# Patient Record
Sex: Female | Born: 1940 | Race: White | Hispanic: No | Marital: Single | Smoking: Former smoker
Health system: Southern US, Community
[De-identification: ages and names within clinical notes are randomized; demographics above are authoritative.]

## PROBLEM LIST (undated history)

## (undated) DIAGNOSIS — R7303 Prediabetes: Secondary | ICD-10-CM

## (undated) DIAGNOSIS — M1990 Unspecified osteoarthritis, unspecified site: Secondary | ICD-10-CM

## (undated) DIAGNOSIS — J189 Pneumonia, unspecified organism: Secondary | ICD-10-CM

## (undated) DIAGNOSIS — Z8719 Personal history of other diseases of the digestive system: Secondary | ICD-10-CM

## (undated) DIAGNOSIS — D649 Anemia, unspecified: Secondary | ICD-10-CM

## (undated) DIAGNOSIS — C801 Malignant (primary) neoplasm, unspecified: Secondary | ICD-10-CM

## (undated) DIAGNOSIS — F419 Anxiety disorder, unspecified: Secondary | ICD-10-CM

## (undated) HISTORY — PX: TRIGGER FINGER RELEASE: SHX641

## (undated) HISTORY — PX: ABDOMINAL HYSTERECTOMY: SHX81

## (undated) HISTORY — PX: TONSILLECTOMY: SUR1361

---

## 2006-12-09 HISTORY — PX: LAPAROSCOPIC GASTRIC SLEEVE RESECTION: SHX5895

## 2006-12-09 HISTORY — PX: NEPHRECTOMY: SHX65

## 2007-12-10 DIAGNOSIS — Z8719 Personal history of other diseases of the digestive system: Secondary | ICD-10-CM

## 2007-12-10 HISTORY — DX: Personal history of other diseases of the digestive system: Z87.19

## 2015-12-10 HISTORY — PX: COLONOSCOPY W/ POLYPECTOMY: SHX1380

## 2016-01-10 DIAGNOSIS — Z905 Acquired absence of kidney: Secondary | ICD-10-CM | POA: Diagnosis not present

## 2016-01-10 DIAGNOSIS — Z85528 Personal history of other malignant neoplasm of kidney: Secondary | ICD-10-CM | POA: Diagnosis not present

## 2016-01-15 DIAGNOSIS — N3946 Mixed incontinence: Secondary | ICD-10-CM | POA: Diagnosis not present

## 2016-01-15 DIAGNOSIS — Z905 Acquired absence of kidney: Secondary | ICD-10-CM | POA: Diagnosis not present

## 2016-01-15 DIAGNOSIS — Z87440 Personal history of urinary (tract) infections: Secondary | ICD-10-CM | POA: Diagnosis not present

## 2016-01-15 DIAGNOSIS — N952 Postmenopausal atrophic vaginitis: Secondary | ICD-10-CM | POA: Diagnosis not present

## 2016-02-13 DIAGNOSIS — M170 Bilateral primary osteoarthritis of knee: Secondary | ICD-10-CM | POA: Diagnosis not present

## 2016-02-13 DIAGNOSIS — M7061 Trochanteric bursitis, right hip: Secondary | ICD-10-CM | POA: Diagnosis not present

## 2016-02-16 DIAGNOSIS — M5416 Radiculopathy, lumbar region: Secondary | ICD-10-CM | POA: Diagnosis not present

## 2016-02-16 DIAGNOSIS — M7061 Trochanteric bursitis, right hip: Secondary | ICD-10-CM | POA: Diagnosis not present

## 2016-02-19 DIAGNOSIS — M5416 Radiculopathy, lumbar region: Secondary | ICD-10-CM | POA: Diagnosis not present

## 2016-02-19 DIAGNOSIS — M5126 Other intervertebral disc displacement, lumbar region: Secondary | ICD-10-CM | POA: Diagnosis not present

## 2016-02-22 DIAGNOSIS — M549 Dorsalgia, unspecified: Secondary | ICD-10-CM | POA: Diagnosis not present

## 2016-02-22 DIAGNOSIS — E1165 Type 2 diabetes mellitus with hyperglycemia: Secondary | ICD-10-CM | POA: Diagnosis not present

## 2016-02-22 DIAGNOSIS — Z6839 Body mass index (BMI) 39.0-39.9, adult: Secondary | ICD-10-CM | POA: Diagnosis not present

## 2016-02-22 DIAGNOSIS — M79604 Pain in right leg: Secondary | ICD-10-CM | POA: Diagnosis not present

## 2016-02-22 DIAGNOSIS — Z87891 Personal history of nicotine dependence: Secondary | ICD-10-CM | POA: Diagnosis not present

## 2016-02-26 ENCOUNTER — Other Ambulatory Visit: Payer: Self-pay | Admitting: Orthopedic Surgery

## 2016-02-26 DIAGNOSIS — M545 Low back pain: Principal | ICD-10-CM

## 2016-02-26 DIAGNOSIS — G8929 Other chronic pain: Secondary | ICD-10-CM

## 2016-02-28 ENCOUNTER — Ambulatory Visit
Admission: RE | Admit: 2016-02-28 | Discharge: 2016-02-28 | Disposition: A | Discharge status: Home / Self Care | Payer: Medicare Other | Source: Ambulatory Visit | Attending: Orthopedic Surgery | Admitting: Orthopedic Surgery

## 2016-02-28 DIAGNOSIS — G8929 Other chronic pain: Secondary | ICD-10-CM

## 2016-02-28 DIAGNOSIS — M545 Low back pain: Principal | ICD-10-CM

## 2016-02-28 DIAGNOSIS — M47816 Spondylosis without myelopathy or radiculopathy, lumbar region: Secondary | ICD-10-CM | POA: Diagnosis not present

## 2016-02-28 MED ORDER — IOHEXOL 180 MG/ML  SOLN
1.0000 mL | Freq: Once | INTRAMUSCULAR | Status: AC | PRN
  Administered 2016-02-28: 1 mL via EPIDURAL

## 2016-02-28 MED ORDER — METHYLPREDNISOLONE ACETATE 40 MG/ML INJ SUSP (RADIOLOG
120.0000 mg | Freq: Once | INTRAMUSCULAR | Status: AC
  Administered 2016-02-28: 120 mg via EPIDURAL

## 2016-02-28 NOTE — Discharge Instructions (Signed)

## 2016-04-26 DIAGNOSIS — M545 Low back pain: Secondary | ICD-10-CM | POA: Diagnosis not present

## 2016-04-26 DIAGNOSIS — E1122 Type 2 diabetes mellitus with diabetic chronic kidney disease: Secondary | ICD-10-CM | POA: Diagnosis not present

## 2016-04-26 DIAGNOSIS — E7800 Pure hypercholesterolemia, unspecified: Secondary | ICD-10-CM | POA: Diagnosis not present

## 2016-04-26 DIAGNOSIS — N182 Chronic kidney disease, stage 2 (mild): Secondary | ICD-10-CM | POA: Diagnosis not present

## 2016-04-26 DIAGNOSIS — N189 Chronic kidney disease, unspecified: Secondary | ICD-10-CM | POA: Diagnosis not present

## 2016-06-20 DIAGNOSIS — Z1389 Encounter for screening for other disorder: Secondary | ICD-10-CM | POA: Diagnosis not present

## 2016-06-20 DIAGNOSIS — N182 Chronic kidney disease, stage 2 (mild): Secondary | ICD-10-CM | POA: Diagnosis not present

## 2016-06-20 DIAGNOSIS — E1122 Type 2 diabetes mellitus with diabetic chronic kidney disease: Secondary | ICD-10-CM | POA: Diagnosis not present

## 2016-06-20 DIAGNOSIS — Z0000 Encounter for general adult medical examination without abnormal findings: Secondary | ICD-10-CM | POA: Diagnosis not present

## 2016-06-20 DIAGNOSIS — Z7189 Other specified counseling: Secondary | ICD-10-CM | POA: Diagnosis not present

## 2016-06-20 DIAGNOSIS — Z1211 Encounter for screening for malignant neoplasm of colon: Secondary | ICD-10-CM | POA: Diagnosis not present

## 2016-06-20 DIAGNOSIS — Z299 Encounter for prophylactic measures, unspecified: Secondary | ICD-10-CM | POA: Diagnosis not present

## 2016-06-20 DIAGNOSIS — Z6838 Body mass index (BMI) 38.0-38.9, adult: Secondary | ICD-10-CM | POA: Diagnosis not present

## 2016-06-21 DIAGNOSIS — M1710 Unilateral primary osteoarthritis, unspecified knee: Secondary | ICD-10-CM | POA: Diagnosis not present

## 2016-07-30 DIAGNOSIS — E559 Vitamin D deficiency, unspecified: Secondary | ICD-10-CM | POA: Diagnosis not present

## 2016-07-30 DIAGNOSIS — E1122 Type 2 diabetes mellitus with diabetic chronic kidney disease: Secondary | ICD-10-CM | POA: Diagnosis not present

## 2016-07-30 DIAGNOSIS — E7800 Pure hypercholesterolemia, unspecified: Secondary | ICD-10-CM | POA: Diagnosis not present

## 2016-08-08 ENCOUNTER — Other Ambulatory Visit: Payer: Self-pay | Admitting: Orthopedic Surgery

## 2016-08-08 ENCOUNTER — Other Ambulatory Visit (INDEPENDENT_AMBULATORY_CARE_PROVIDER_SITE_OTHER): Payer: Federal, State, Local not specified - PPO

## 2016-08-08 ENCOUNTER — Ambulatory Visit (INDEPENDENT_AMBULATORY_CARE_PROVIDER_SITE_OTHER): Payer: Medicare Other

## 2016-08-08 DIAGNOSIS — M25562 Pain in left knee: Secondary | ICD-10-CM

## 2016-08-08 DIAGNOSIS — M25561 Pain in right knee: Secondary | ICD-10-CM

## 2016-08-08 DIAGNOSIS — M170 Bilateral primary osteoarthritis of knee: Secondary | ICD-10-CM | POA: Diagnosis not present

## 2016-09-02 DIAGNOSIS — Z1211 Encounter for screening for malignant neoplasm of colon: Secondary | ICD-10-CM | POA: Diagnosis not present

## 2016-09-02 DIAGNOSIS — D124 Benign neoplasm of descending colon: Secondary | ICD-10-CM | POA: Diagnosis not present

## 2016-09-19 DIAGNOSIS — M170 Bilateral primary osteoarthritis of knee: Secondary | ICD-10-CM | POA: Diagnosis not present

## 2016-09-27 DIAGNOSIS — Z6838 Body mass index (BMI) 38.0-38.9, adult: Secondary | ICD-10-CM | POA: Diagnosis not present

## 2016-09-27 DIAGNOSIS — Z299 Encounter for prophylactic measures, unspecified: Secondary | ICD-10-CM | POA: Diagnosis not present

## 2016-09-27 DIAGNOSIS — N182 Chronic kidney disease, stage 2 (mild): Secondary | ICD-10-CM | POA: Diagnosis not present

## 2016-09-27 DIAGNOSIS — Z713 Dietary counseling and surveillance: Secondary | ICD-10-CM | POA: Diagnosis not present

## 2016-09-27 DIAGNOSIS — E1122 Type 2 diabetes mellitus with diabetic chronic kidney disease: Secondary | ICD-10-CM | POA: Diagnosis not present

## 2016-09-30 DIAGNOSIS — H40033 Anatomical narrow angle, bilateral: Secondary | ICD-10-CM | POA: Diagnosis not present

## 2016-09-30 DIAGNOSIS — E119 Type 2 diabetes mellitus without complications: Secondary | ICD-10-CM | POA: Diagnosis not present

## 2016-10-10 DIAGNOSIS — G44219 Episodic tension-type headache, not intractable: Secondary | ICD-10-CM | POA: Diagnosis not present

## 2016-11-12 DIAGNOSIS — H43813 Vitreous degeneration, bilateral: Secondary | ICD-10-CM | POA: Diagnosis not present

## 2016-11-12 DIAGNOSIS — H2513 Age-related nuclear cataract, bilateral: Secondary | ICD-10-CM | POA: Diagnosis not present

## 2016-11-14 ENCOUNTER — Ambulatory Visit: Payer: Self-pay | Admitting: Orthopedic Surgery

## 2016-11-25 DIAGNOSIS — Z1231 Encounter for screening mammogram for malignant neoplasm of breast: Secondary | ICD-10-CM | POA: Diagnosis not present

## 2016-12-04 DIAGNOSIS — R3914 Feeling of incomplete bladder emptying: Secondary | ICD-10-CM | POA: Diagnosis not present

## 2016-12-04 DIAGNOSIS — Z9889 Other specified postprocedural states: Secondary | ICD-10-CM | POA: Diagnosis not present

## 2016-12-04 DIAGNOSIS — N952 Postmenopausal atrophic vaginitis: Secondary | ICD-10-CM | POA: Diagnosis not present

## 2016-12-04 DIAGNOSIS — N3946 Mixed incontinence: Secondary | ICD-10-CM | POA: Diagnosis not present

## 2016-12-04 DIAGNOSIS — Z87440 Personal history of urinary (tract) infections: Secondary | ICD-10-CM | POA: Diagnosis not present

## 2016-12-04 DIAGNOSIS — Z85528 Personal history of other malignant neoplasm of kidney: Secondary | ICD-10-CM | POA: Diagnosis not present

## 2016-12-10 DIAGNOSIS — M1712 Unilateral primary osteoarthritis, left knee: Secondary | ICD-10-CM | POA: Diagnosis not present

## 2016-12-13 ENCOUNTER — Ambulatory Visit: Payer: Self-pay | Admitting: Orthopedic Surgery

## 2016-12-13 NOTE — H&P (Signed)
Carol Doyle DOB: May 03, 1941 Single / Language: Carol Doyle / Race: White Female Date of Admission:  01/06/2017 CC:  Left knee pain History of Present Illness The patient is a 76 year old female who comes in for a preoperative History and Physical. The patient is scheduled for a left total knee arthroplasty to be performed by Dr. Dione Plover. Aluisio, MD at [facility] on 01-06-2017. The patient is a 76 year old female who presented for follow up of their knee. The patient is being followed for their bilateral knee pain. Symptoms reported include: pain. The patient feels that they are doing poorly and report their pain level to be mild to moderate. She states her left knee has gotten progressively worse. She has been wanted to get it fixed for a while but it was taking care of her elderly mother. Her mother recently passed away and then Carol Doyle is now at a stage where she can go ahead and get her knee fixed. The left knee is hurting at all times. It is limiting what she can and cannot do. She is having pain as well as significant functional difficulties. It has been going on for many years. She has had injections in the past without benefit. They have been treated conservatively in the past for the above stated problem and despite conservative measures, they continue to have progressive pain and severe functional limitations and dysfunction. They have failed non-operative management including home exercise, medications, and injections. It is felt that they would benefit from undergoing total joint replacement. Risks and benefits of the procedure have been discussed with the patient and they elect to proceed with surgery. There are no active contraindications to surgery such as ongoing infection or rapidly progressive neurological disease.  Problem List/Past Medical Primary osteoarthritis of both knees (M17.0)  Anxiety Disorder  Chronic Pain  Osteoarthritis  Urinary Tract Infection  Past  History of Chronic Infections Urinary Incontinence  Impaired Hearing  Degenerative Disc Disease  Bursitis  Bilateral Hip Abdominal Incisional Hernia Right Abdomen   Allergies No Known Drug Allergies   Family History Cancer  Brother, Father, Sister. Cerebrovascular Accident  Mother. Diabetes Mellitus  Mother. Heart Disease  Mother. Hypertension  Mother. Osteoarthritis  Brother, Maternal Grandmother, Mother. Severe allergy  Maternal Grandmother, Mother.  Social History Children  2 Current drinker  08/07/2016: Currently drinks beer and wine only occasionally per week Current work status  retired Furniture conservator/restorer weekly; does other Living situation  live with parents Marital status  divorced No history of drug/alcohol rehab  Number of flights of stairs before winded  less than 1 Tobacco / smoke exposure  08/07/2016: no Tobacco use  Former smoker. 08/07/2016: smoke(d) 3 more pack(s) per day  Medication History TraMADol HCl (50MG  Tablet, Oral) Active. FLUoxetine HCl (40MG  Capsule, Oral) Active. Benadryl (25MG  Tablet, Oral) Active. Myrbetriq (50MG  Tablet ER 24HR, Oral) Active. Atorvastatin Calcium (10MG  Tablet, Oral) Active. Premarin (0.625MG /GM Cream, Vaginal) Active. Vitamin D3 (1000UNIT Tablet, Oral) Active.  Past Surgical History Dilation and Curettage of Uterus - Multiple  Hysterectomy  partial (non-cancerous) Kidney Removal  right Tonsillectomy    Review of Systems  General Not Present- Chills, Fatigue, Fever, Memory Loss, Night Sweats, Weight Gain and Weight Loss. Skin Not Present- Eczema, Hives, Itching, Lesions and Rash. HEENT Not Present- Dentures, Double Vision, Headache, Hearing Loss, Tinnitus and Visual Loss. Respiratory Not Present- Allergies, Chronic Cough, Coughing up blood, Shortness of breath at rest and Shortness of breath with exertion. Cardiovascular Not Present- Chest  Pain, Difficulty Breathing Lying Down, Murmur,  Palpitations, Racing/skipping heartbeats and Swelling. Gastrointestinal Not Present- Abdominal Pain, Bloody Stool, Constipation, Diarrhea, Difficulty Swallowing, Heartburn, Jaundice, Loss of appetitie, Nausea and Vomiting. Female Genitourinary Not Present- Blood in Urine, Discharge, Flank Pain, Incontinence, Painful Urination, Urgency, Urinary frequency, Urinary Retention, Urinating at Night and Weak urinary stream. Musculoskeletal Present- Joint Pain and Morning Stiffness. Not Present- Back Pain, Joint Swelling, Muscle Pain, Muscle Weakness and Spasms. Neurological Not Present- Blackout spells, Difficulty with balance, Dizziness, Paralysis, Tremor and Weakness. Psychiatric Not Present- Insomnia.  Vitals  Weight: 251 lb Height: 67in Body Surface Area: 2.23 m Body Mass Index: 39.31 kg/m  Pulse: 76 (Regular)  BP: 146/82 (Sitting, Right Arm, Standard)   Physical Exam  General Mental Status -Alert, cooperative and good historian. General Appearance-pleasant, Not in acute distress. Orientation-Oriented X3. Build & Nutrition-Well nourished and Well developed.  Head and Neck Head-normocephalic, atraumatic . Neck Global Assessment - supple, no bruit auscultated on the right, no bruit auscultated on the left.  Eye Vision-Wears corrective lenses. Pupil - Bilateral-Regular and Round. Motion - Bilateral-EOMI.  Chest and Lung Exam Auscultation Breath sounds - clear at anterior chest wall and clear at posterior chest wall. Adventitious sounds - No Adventitious sounds.  Cardiovascular Auscultation Rhythm - Regular rate and rhythm. Heart Sounds - S1 WNL and S2 WNL. Murmurs & Other Heart Sounds - Auscultation of the heart reveals - No Murmurs.  Abdomen Inspection Hernias - Hernias - Incisional - Note: incisional hernia right lower abdomen. Contour - Generalized moderate distention. Incisional scars - Laparotomy scar(Right lower  abdomen). Palpation/Percussion Tenderness - Abdomen is non-tender to palpation. Rigidity (guarding) - Abdomen is soft. Auscultation Auscultation of the abdomen reveals - Bowel sounds normal.  Female Genitourinary Note: Not done, not pertinent to present illness   Musculoskeletal Note: On exam, well-developed female in no distress. Her hips show normal range of motion, no discomfort. Her left knee shows no effusion. Her range about 5 to 125. There is marked crepitus on range of motion with tenderness medial greater than lateral and no instability noted. Right knee has 0 to 135, moderate crepitus on range of motion, slight tenderness medial greater than lateral and no instability.  Her x-rays of left knee show bone-on-bone arthritis in the medial and patellofemoral compartments.   Assessment & Plan Primary osteoarthritis of left knee (M17.12)  Note:Surgical Plans: Left Total Knee Replacement  Disposition: Home and plans to do in home Virtual Therapy utilizing the [facility].  PCP: Dr. Manuella Ghazi - Pending at time of H&P  IV TXA  Anesthesia Issues: None  Patient was instructed on what medications to stop prior to surgery.  Signed electronically by Ok Edwards, III PA-C

## 2016-12-27 ENCOUNTER — Other Ambulatory Visit (HOSPITAL_COMMUNITY): Payer: Self-pay | Admitting: *Deleted

## 2016-12-27 DIAGNOSIS — Z01818 Encounter for other preprocedural examination: Secondary | ICD-10-CM | POA: Diagnosis not present

## 2016-12-27 DIAGNOSIS — M25562 Pain in left knee: Secondary | ICD-10-CM | POA: Diagnosis not present

## 2016-12-27 NOTE — Patient Instructions (Addendum)
Carol Doyle  12/27/2016   Your procedure is scheduled on: 01-06-17  Report to [facility]  Entrance take [facility]  elevators to 3rd floor to  [place] at 940  AM.  Call this number if you have problems the morning of surgery [phone]   Remember: ONLY 1 PERSON MAY GO WITH YOU TO SHORT STAY TO GET  READY MORNING OF [place].  Do not eat food or drink liquids :After Midnight.     Take these medicines the morning of surgery with A SIP OF WATER: Prozac, Lipitor, Mybetriq                               You may not have any metal on your body including hair pins and              piercings  Do not wear jewelry, make-up, lotions, powders or perfumes, deodorant             Do not wear nail polish.  Do not shave  48 hours prior to surgery.              Men may shave face and neck.   Do not bring valuables to the hospital. [name]  Contacts, dentures or bridgework may not be worn into surgery.  Leave suitcase in the car. After surgery it may be brought to your room.                 Please read over the following fact sheets you were given: _____________________________________________________________________             [facility] - Preparing for Surgery Before surgery, you can play an important role.  Because skin is not sterile, your skin needs to be as free of germs as possible.  You can reduce the number of germs on your skin by washing with CHG (chlorahexidine gluconate) soap before surgery.  CHG is an antiseptic cleaner which kills germs and bonds with the skin to continue killing germs even after washing. Please DO NOT use if you have an allergy to CHG or antibacterial soaps.  If your skin becomes reddened/irritated stop using the CHG and inform your nurse when you arrive at Short Stay. Do not shave (including legs and underarms) for at least 48 hours prior to the first CHG shower.  You may  shave your face/neck. Please follow these instructions carefully:  1.  Shower with CHG Soap the night before surgery and the  morning of Surgery.  2.  If you choose to wash your hair, wash your hair first as usual with your  normal  shampoo.  3.  After you shampoo, rinse your hair and body thoroughly to remove the  shampoo.                           4.  Use CHG as you would any other liquid soap.  You can apply chg directly  to the skin and wash                       Gently with a scrungie or clean washcloth.  5.  Apply the CHG Soap  to your body ONLY FROM THE NECK DOWN.   Do not use on face/ open                           Wound or open sores. Avoid contact with eyes, ears mouth and genitals (private parts).                       Wash face,  Genitals (private parts) with your normal soap.             6.  Wash thoroughly, paying special attention to the area where your surgery  will be performed.  7.  Thoroughly rinse your body with warm water from the neck down.  8.  DO NOT shower/wash with your normal soap after using and rinsing off  the CHG Soap.                9.  Pat yourself dry with a clean towel.            10.  Wear clean pajamas.            11.  Place clean sheets on your bed the night of your first shower and do not  sleep with pets. Day of Surgery : Do not apply any lotions/deodorants the morning of surgery.  Please wear clean clothes to the hospital/surgery center.  FAILURE TO FOLLOW THESE INSTRUCTIONS MAY RESULT IN THE CANCELLATION OF YOUR SURGERY PATIENT SIGNATURE_________________________________  NURSE SIGNATURE__________________________________  ________________________________________________________________________   [name]  An incentive spirometer is a tool that can help keep your lungs clear and active. This tool measures how well you are filling your lungs with each breath. Taking long deep breaths may help reverse or decrease the chance of developing  breathing (pulmonary) problems (especially infection) following:  A long period of time when you are unable to move or be active. BEFORE THE PROCEDURE   If the spirometer includes an indicator to show your best effort, your nurse or respiratory therapist will set it to a desired goal.  If possible, sit up straight or lean slightly forward. Try not to slouch.  Hold the incentive spirometer in an upright position. INSTRUCTIONS FOR USE  1. Sit on the edge of your bed if possible, or sit up as far as you can in bed or on a chair. 2. Hold the incentive spirometer in an upright position. 3. Breathe out normally. 4. Place the mouthpiece in your mouth and seal your lips tightly around it. 5. Breathe in slowly and as deeply as possible, raising the piston or the ball toward the top of the column. 6. Hold your breath for 3-5 seconds or for as long as possible. Allow the piston or ball to fall to the bottom of the column. 7. Remove the mouthpiece from your mouth and breathe out normally. 8. Rest for a few seconds and repeat Steps 1 through 7 at least 10 times every 1-2 hours when you are awake. Take your time and take a few normal breaths between deep breaths. 9. The spirometer may include an indicator to show your best effort. Use the indicator as a goal to work toward during each repetition. 10. After each set of 10 deep breaths, practice coughing to be sure your lungs are clear. If you have an incision (the cut made at the time of surgery), support your incision when coughing by placing a pillow or rolled up towels firmly  against it. Once you are able to get out of bed, walk around indoors and cough well. You may stop using the incentive spirometer when instructed by your caregiver.  RISKS AND COMPLICATIONS  Take your time so you do not get dizzy or light-headed.  If you are in pain, you may need to take or ask for pain medication before doing incentive spirometry. It is harder to take a deep  breath if you are having pain. AFTER USE  Rest and breathe slowly and easily.  It can be helpful to keep track of a log of your progress. Your caregiver can provide you with a simple table to help with this. If you are using the spirometer at home, follow these instructions: [place] IF:   You are having difficultly using the spirometer.  You have trouble using the spirometer as often as instructed.  Your pain medication is not giving enough relief while using the spirometer.  You develop fever of 100.5 F (38.1 C) or higher. SEEK IMMEDIATE MEDICAL CARE IF:   You cough up bloody sputum that had not been present before.  You develop fever of 102 F (38.9 C) or greater.  You develop worsening pain at or near the incision site. MAKE SURE YOU:   Understand these instructions.  Will watch your condition.  Will get help right away if you are not doing well or get worse. Document Released: 04/07/2007 Document Revised: 02/17/2012 Document Reviewed: 06/08/2007 ExitCare Patient Information 2014 ExitCare, [place].   ________________________________________________________________________  WHAT IS A BLOOD TRANSFUSION? Blood Transfusion Information  A transfusion is the replacement of blood or some of its parts. Blood is made up of multiple cells which provide different functions.  Red blood cells carry oxygen and are used for blood loss replacement.  White blood cells fight against infection.  Platelets control bleeding.  Plasma helps clot blood.  Other blood products are available for specialized needs, such as hemophilia or other clotting disorders. BEFORE THE TRANSFUSION  Who gives blood for transfusions?   Healthy volunteers who are fully evaluated to make sure their blood is safe. This is blood bank blood. Transfusion therapy is the safest it has ever been in the practice of medicine. Before blood is taken from a donor, a complete history is taken to make sure  that person has no history of diseases nor engages in risky social behavior (examples are intravenous drug use or sexual activity with multiple partners). The donor's travel history is screened to minimize risk of transmitting infections, such as malaria. The donated blood is tested for signs of infectious diseases, such as HIV and hepatitis. The blood is then tested to be sure it is compatible with you in order to minimize the chance of a transfusion reaction. If you or a relative donates blood, this is often done in anticipation of surgery and is not appropriate for emergency situations. It takes many days to process the donated blood. RISKS AND COMPLICATIONS Although transfusion therapy is very safe and saves many lives, the main dangers of transfusion include:   Getting an infectious disease.  Developing a transfusion reaction. This is an allergic reaction to something in the blood you were given. Every precaution is taken to prevent this. The decision to have a blood transfusion has been considered carefully by your caregiver before blood is given. Blood is not given unless the benefits outweigh the risks. AFTER THE TRANSFUSION  Right after receiving a blood transfusion, you will usually feel much better and  more energetic. This is especially true if your red blood cells have gotten low (anemic). The transfusion raises the level of the red blood cells which carry oxygen, and this usually causes an energy increase.  The nurse administering the transfusion will monitor you carefully for complications. HOME CARE INSTRUCTIONS  No special instructions are needed after a transfusion. You may find your energy is better. Speak with your caregiver about any limitations on activity for underlying diseases you may have. SEEK MEDICAL CARE IF:   Your condition is not improving after your transfusion.  You develop redness or irritation at the intravenous (IV) site. SEEK IMMEDIATE MEDICAL CARE IF:  Any of  the following symptoms occur over the next 12 hours:  Shaking chills.  You have a temperature by mouth above 102 F (38.9 C), not controlled by medicine.  Chest, back, or muscle pain.  People around you feel you are not acting correctly or are confused.  Shortness of breath or difficulty breathing.  Dizziness and fainting.  You get a rash or develop hives.  You have a decrease in urine output.  Your urine turns a dark color or changes to pink, red, or brown. Any of the following symptoms occur over the next 10 days:  You have a temperature by mouth above 102 F (38.9 C), not controlled by medicine.  Shortness of breath.  Weakness after normal activity.  The white part of the eye turns yellow (jaundice).  You have a decrease in the amount of urine or are urinating less often.  Your urine turns a dark color or changes to pink, red, or brown. Document Released: 11/22/2000 Document Revised: 02/17/2012 Document Reviewed: 07/11/2008 [facility] Patient Information 2014 [place], [place].  _______________________________________________________________________

## 2016-12-30 ENCOUNTER — Encounter (HOSPITAL_COMMUNITY)
Admission: RE | Admit: 2016-12-30 | Discharge: 2016-12-30 | Disposition: A | Discharge status: Home / Self Care | Payer: Medicare Other | Source: Ambulatory Visit | Attending: Orthopedic Surgery | Admitting: Orthopedic Surgery

## 2016-12-30 ENCOUNTER — Encounter (HOSPITAL_COMMUNITY): Payer: Self-pay | Admitting: Emergency Medicine

## 2016-12-30 DIAGNOSIS — Z01812 Encounter for preprocedural laboratory examination: Secondary | ICD-10-CM | POA: Insufficient documentation

## 2016-12-30 DIAGNOSIS — D649 Anemia, unspecified: Secondary | ICD-10-CM | POA: Insufficient documentation

## 2016-12-30 DIAGNOSIS — F419 Anxiety disorder, unspecified: Secondary | ICD-10-CM | POA: Insufficient documentation

## 2016-12-30 DIAGNOSIS — R7303 Prediabetes: Secondary | ICD-10-CM | POA: Insufficient documentation

## 2016-12-30 HISTORY — DX: Prediabetes: R73.03

## 2016-12-30 HISTORY — DX: Anxiety disorder, unspecified: F41.9

## 2016-12-30 HISTORY — DX: Unspecified osteoarthritis, unspecified site: M19.90

## 2016-12-30 HISTORY — DX: Pneumonia, unspecified organism: J18.9

## 2016-12-30 HISTORY — DX: Malignant (primary) neoplasm, unspecified: C80.1

## 2016-12-30 HISTORY — DX: Personal history of other diseases of the digestive system: Z87.19

## 2016-12-30 HISTORY — DX: Anemia, unspecified: D64.9

## 2016-12-30 LAB — APTT: APTT: 30 s (ref 24–36)

## 2016-12-30 LAB — COMPREHENSIVE METABOLIC PANEL
ALBUMIN: 3.4 g/dL — AB (ref 3.5–5.0)
ALT: 16 U/L (ref 14–54)
AST: 19 U/L (ref 15–41)
Alkaline Phosphatase: 57 U/L (ref 38–126)
Anion gap: 7 (ref 5–15)
BUN: 17 mg/dL (ref 6–20)
CHLORIDE: 104 mmol/L (ref 101–111)
CO2: 27 mmol/L (ref 22–32)
CREATININE: 0.9 mg/dL (ref 0.44–1.00)
Calcium: 8.4 mg/dL — ABNORMAL LOW (ref 8.9–10.3)
GFR calc Af Amer: >60 mL/min (ref >60)
GFR calc non Af Amer: >60 mL/min (ref >60)
Glucose, Bld: 91 mg/dL (ref 65–99)
POTASSIUM: 4.1 mmol/L (ref 3.5–5.1)
SODIUM: 138 mmol/L (ref 135–145)
Total Bilirubin: 0.8 mg/dL (ref 0.3–1.2)
Total Protein: 6.8 g/dL (ref 6.5–8.1)

## 2016-12-30 LAB — CBC
HCT: 42.9 % (ref 36.0–46.0)
Hemoglobin: 14.5 g/dL (ref 12.0–15.0)
MCH: 29.8 pg (ref 26.0–34.0)
MCHC: 33.8 g/dL (ref 30.0–36.0)
MCV: 88.3 fL (ref 78.0–100.0)
PLATELETS: 248 10*3/uL (ref 150–400)
RBC: 4.86 MIL/uL (ref 3.87–5.11)
RDW: 14.1 % (ref 11.5–15.5)
WBC: 9.9 10*3/uL (ref 4.0–10.5)

## 2016-12-30 LAB — TYPE AND SCREEN
ABO/RH(D): O POS
ANTIBODY SCREEN: NEGATIVE

## 2016-12-30 LAB — SURGICAL PCR SCREEN
MRSA, PCR: NEGATIVE
STAPHYLOCOCCUS AUREUS: NEGATIVE

## 2016-12-30 LAB — PROTIME-INR
INR: 0.97
Prothrombin Time: 12.8 seconds (ref 11.4–15.2)

## 2016-12-30 LAB — ABO/RH: ABO/RH(D): O POS

## 2016-12-31 LAB — HEMOGLOBIN A1C
HEMOGLOBIN A1C: 5.6 % (ref 4.8–5.6)
MEAN PLASMA GLUCOSE: 114 mg/dL

## 2017-01-01 DIAGNOSIS — N952 Postmenopausal atrophic vaginitis: Secondary | ICD-10-CM | POA: Diagnosis not present

## 2017-01-01 DIAGNOSIS — Z9889 Other specified postprocedural states: Secondary | ICD-10-CM | POA: Diagnosis not present

## 2017-01-01 DIAGNOSIS — Z87440 Personal history of urinary (tract) infections: Secondary | ICD-10-CM | POA: Diagnosis not present

## 2017-01-01 DIAGNOSIS — Z85528 Personal history of other malignant neoplasm of kidney: Secondary | ICD-10-CM | POA: Diagnosis not present

## 2017-01-01 DIAGNOSIS — N3946 Mixed incontinence: Secondary | ICD-10-CM | POA: Diagnosis not present

## 2017-01-03 ENCOUNTER — Ambulatory Visit: Payer: Self-pay | Admitting: Orthopedic Surgery

## 2017-01-03 NOTE — H&P (Signed)
Carol Doyle DOB: March 19, 1941 Single / Language: Carol Doyle / Race: White Female Date of Admission:  01/06/2017 CC:  Left knee pain History of Present Illness The patient is a 76 year old female who comes in for a preoperative History and Physical. The patient is scheduled for a left total knee arthroplasty to be performed by Dr. Dione Plover. Aluisio, MD at [facility] on 01-06-2017. The patient is a 76 year old female who presented for follow up of their knee. The patient is being followed for their bilateral knee pain. Symptoms reported include: pain. The patient feels that they are doing poorly and report their pain level to be mild to moderate. She states her left knee has gotten progressively worse. She has been wanted to get it fixed for a while but it was taking care of her elderly mother. Her mother recently passed away and then Remedi is now at a stage where she can go ahead and get her knee fixed. The left knee is hurting at all times. It is limiting what she can and cannot do. She is having pain as well as significant functional difficulties. It has been going on for many years. She has had injections in the past without benefit. They have been treated conservatively in the past for the above stated problem and despite conservative measures, they continue to have progressive pain and severe functional limitations and dysfunction. They have failed non-operative management including home exercise, medications, and injections. It is felt that they would benefit from undergoing total joint replacement. Risks and benefits of the procedure have been discussed with the patient and they elect to proceed with surgery. There are no active contraindications to surgery such as ongoing infection or rapidly progressive neurological disease.  Problem List/Past Medical Primary osteoarthritis of both knees (M17.0)  Anxiety Disorder  Chronic Pain  Osteoarthritis  Urinary Tract Infection   Past History of Chronic Infections Urinary Incontinence  Impaired Hearing  Degenerative Disc Disease  Bursitis  Bilateral Hip Abdominal Incisional Hernia Right Abdomen   Allergies No Known Drug Allergies   Family History Cancer  Brother, Father, Sister. Cerebrovascular Accident  Mother. Diabetes Mellitus  Mother. Heart Disease  Mother. Hypertension  Mother. Osteoarthritis  Brother, Maternal Grandmother, Mother. Severe allergy  Maternal Grandmother, Mother.  Social History Children  2 Current drinker  08/07/2016: Currently drinks beer and wine only occasionally per week Current work status  retired Furniture conservator/restorer weekly; does other Living situation  live with parents Marital status  divorced No history of drug/alcohol rehab  Number of flights of stairs before winded  less than 1 Tobacco / smoke exposure  08/07/2016: no Tobacco use  Former smoker. 08/07/2016: smoke(d) 3 more pack(s) per day  Medication History TraMADol HCl (50MG  Tablet, Oral) Active. FLUoxetine HCl (40MG  Capsule, Oral) Active. Benadryl (25MG  Tablet, Oral) Active. Myrbetriq (50MG  Tablet ER 24HR, Oral) Active. Atorvastatin Calcium (10MG  Tablet, Oral) Active. Premarin (0.625MG /GM Cream, Vaginal) Active. Vitamin D3 (1000UNIT Tablet, Oral) Active.  Past Surgical History Dilation and Curettage of Uterus - Multiple  Hysterectomy  partial (non-cancerous) Kidney Removal  right Tonsillectomy    Review of Systems  General Not Present- Chills, Fatigue, Fever, Memory Loss, Night Sweats, Weight Gain and Weight Loss. Skin Not Present- Eczema, Hives, Itching, Lesions and Rash. HEENT Not Present- Dentures, Double Vision, Headache, Hearing Loss, Tinnitus and Visual Loss. Respiratory Not Present- Allergies, Chronic Cough, Coughing up blood, Shortness of breath at rest and Shortness of breath with exertion. Cardiovascular Not Present- Chest  Pain, Difficulty Breathing Lying  Down, Murmur, Palpitations, Racing/skipping heartbeats and Swelling. Gastrointestinal Not Present- Abdominal Pain, Bloody Stool, Constipation, Diarrhea, Difficulty Swallowing, Heartburn, Jaundice, Loss of appetitie, Nausea and Vomiting. Female Genitourinary Not Present- Blood in Urine, Discharge, Flank Pain, Incontinence, Painful Urination, Urgency, Urinary frequency, Urinary Retention, Urinating at Night and Weak urinary stream. Musculoskeletal Present- Joint Pain and Morning Stiffness. Not Present- Back Pain, Joint Swelling, Muscle Pain, Muscle Weakness and Spasms. Neurological Not Present- Blackout spells, Difficulty with balance, Dizziness, Paralysis, Tremor and Weakness. Psychiatric Not Present- Insomnia.  Vitals  Weight: 251 lb Height: 67in Body Surface Area: 2.23 m Body Mass Index: 39.31 kg/m  Pulse: 76 (Regular)  BP: 146/82 (Sitting, Right Arm, Standard)   Physical Exam  General Mental Status -Alert, cooperative and good historian. General Appearance-pleasant, Not in acute distress. Orientation-Oriented X3. Build & Nutrition-Well nourished and Well developed.  Head and Neck Head-normocephalic, atraumatic . Neck Global Assessment - supple, no bruit auscultated on the right, no bruit auscultated on the left.  Eye Vision-Wears corrective lenses. Pupil - Bilateral-Regular and Round. Motion - Bilateral-EOMI.  Chest and Lung Exam Auscultation Breath sounds - clear at anterior chest wall and clear at posterior chest wall. Adventitious sounds - No Adventitious sounds.  Cardiovascular Auscultation Rhythm - Regular rate and rhythm. Heart Sounds - S1 WNL and S2 WNL. Murmurs & Other Heart Sounds - Auscultation of the heart reveals - No Murmurs.  Abdomen Inspection Hernias - Hernias - Incisional - Note: incisional hernia right lower abdomen. Contour - Generalized moderate distention. Incisional scars - Laparotomy scar(Right lower  abdomen). Palpation/Percussion Tenderness - Abdomen is non-tender to palpation. Rigidity (guarding) - Abdomen is soft. Auscultation Auscultation of the abdomen reveals - Bowel sounds normal.  Female Genitourinary Note: Not done, not pertinent to present illness   Musculoskeletal Note: On exam, well-developed female in no distress. Her hips show normal range of motion, no discomfort. Her left knee shows no effusion. Her range about 5 to 125. There is marked crepitus on range of motion with tenderness medial greater than lateral and no instability noted. Right knee has 0 to 135, moderate crepitus on range of motion, slight tenderness medial greater than lateral and no instability.  Her x-rays of left knee show bone-on-bone arthritis in the medial and patellofemoral compartments.   Assessment & Plan Primary osteoarthritis of left knee (M17.12)  Note:Surgical Plans: Left Total Knee Replacement  Disposition: Home and plans to do in home Virtual Therapy utilizing the [place].  PCP: Dr. Manuella Ghazi - Pending at time of H&P  IV TXA  Anesthesia Issues: None  Patient was instructed on what medications to stop prior to surgery.  Signed electronically by Ok Edwards, III PA-C

## 2017-01-06 ENCOUNTER — Encounter (HOSPITAL_COMMUNITY): Payer: Self-pay | Admitting: *Deleted

## 2017-01-06 ENCOUNTER — Inpatient Hospital Stay (HOSPITAL_COMMUNITY)
Admission: RE | Admit: 2017-01-06 | Discharge: 2017-01-08 | DRG: 470 | Disposition: A | Discharge status: Home / Self Care | Payer: Medicare Other | Source: Ambulatory Visit | Attending: Orthopedic Surgery | Admitting: Orthopedic Surgery

## 2017-01-06 ENCOUNTER — Encounter (HOSPITAL_COMMUNITY)
Admission: RE | Disposition: A | Discharge status: Home / Self Care | Payer: Self-pay | Source: Ambulatory Visit | Attending: Orthopedic Surgery

## 2017-01-06 ENCOUNTER — Inpatient Hospital Stay (HOSPITAL_COMMUNITY): Payer: Medicare Other | Admitting: Anesthesiology

## 2017-01-06 DIAGNOSIS — M1712 Unilateral primary osteoarthritis, left knee: Principal | ICD-10-CM | POA: Diagnosis present

## 2017-01-06 DIAGNOSIS — Z87891 Personal history of nicotine dependence: Secondary | ICD-10-CM | POA: Diagnosis not present

## 2017-01-06 DIAGNOSIS — Z79899 Other long term (current) drug therapy: Secondary | ICD-10-CM

## 2017-01-06 DIAGNOSIS — M1710 Unilateral primary osteoarthritis, unspecified knee: Secondary | ICD-10-CM | POA: Diagnosis present

## 2017-01-06 DIAGNOSIS — Z85528 Personal history of other malignant neoplasm of kidney: Secondary | ICD-10-CM

## 2017-01-06 DIAGNOSIS — M179 Osteoarthritis of knee, unspecified: Secondary | ICD-10-CM | POA: Diagnosis present

## 2017-01-06 DIAGNOSIS — G8918 Other acute postprocedural pain: Secondary | ICD-10-CM | POA: Diagnosis not present

## 2017-01-06 DIAGNOSIS — M25562 Pain in left knee: Secondary | ICD-10-CM | POA: Diagnosis not present

## 2017-01-06 HISTORY — PX: TOTAL KNEE ARTHROPLASTY: SHX125

## 2017-01-06 LAB — GLUCOSE, CAPILLARY: Glucose-Capillary: 98 mg/dL (ref 65–99)

## 2017-01-06 SURGERY — ARTHROPLASTY, KNEE, TOTAL
Anesthesia: Spinal | Laterality: Left

## 2017-01-06 MED ORDER — MIDAZOLAM HCL 2 MG/2ML IJ SOLN
INTRAMUSCULAR | Status: AC
  Administered 2017-01-06: 1 mg
  Filled 2017-01-06: qty 2

## 2017-01-06 MED ORDER — OXYCODONE HCL 5 MG PO TABS
5.0000 mg | ORAL_TABLET | ORAL | Status: DC | PRN
  Administered 2017-01-06: 5 mg via ORAL
  Administered 2017-01-06 – 2017-01-07 (×2): 10 mg via ORAL
  Administered 2017-01-07 (×4): 5 mg via ORAL
  Administered 2017-01-07 – 2017-01-08 (×4): 10 mg via ORAL
  Filled 2017-01-06: qty 2
  Filled 2017-01-06: qty 1
  Filled 2017-01-06: qty 2
  Filled 2017-01-06: qty 1
  Filled 2017-01-06: qty 2
  Filled 2017-01-06: qty 1
  Filled 2017-01-06: qty 2
  Filled 2017-01-06: qty 1
  Filled 2017-01-06 (×2): qty 2
  Filled 2017-01-06: qty 1

## 2017-01-06 MED ORDER — CEFAZOLIN SODIUM-DEXTROSE 2-4 GM/100ML-% IV SOLN
INTRAVENOUS | Status: AC
  Filled 2017-01-06: qty 100

## 2017-01-06 MED ORDER — ONDANSETRON HCL 4 MG/2ML IJ SOLN
INTRAMUSCULAR | Status: AC
  Filled 2017-01-06: qty 2

## 2017-01-06 MED ORDER — FENTANYL CITRATE (PF) 100 MCG/2ML IJ SOLN
INTRAMUSCULAR | Status: AC
  Filled 2017-01-06: qty 2

## 2017-01-06 MED ORDER — CEFAZOLIN SODIUM-DEXTROSE 2-4 GM/100ML-% IV SOLN
2.0000 g | INTRAVENOUS | Status: AC
  Administered 2017-01-06: 2 g via INTRAVENOUS

## 2017-01-06 MED ORDER — FENTANYL CITRATE (PF) 100 MCG/2ML IJ SOLN
50.0000 ug | Freq: Once | INTRAMUSCULAR | Status: AC
  Administered 2017-01-06: 50 ug via INTRAVENOUS

## 2017-01-06 MED ORDER — CHLORHEXIDINE GLUCONATE 4 % EX LIQD: 60.0000 mL | Freq: Once | CUTANEOUS | Status: DC

## 2017-01-06 MED ORDER — BISACODYL 10 MG RE SUPP: 10.0000 mg | Freq: Every day | RECTAL | Status: DC | PRN

## 2017-01-06 MED ORDER — SODIUM CHLORIDE 0.9 % IV SOLN
INTRAVENOUS | Status: DC
  Administered 2017-01-06: 17:00:00 via INTRAVENOUS

## 2017-01-06 MED ORDER — POLYETHYLENE GLYCOL 3350 17 G PO PACK: 17.0000 g | PACK | Freq: Every day | ORAL | Status: DC | PRN

## 2017-01-06 MED ORDER — HYDROMORPHONE HCL 1 MG/ML IJ SOLN
INTRAMUSCULAR | Status: DC
Start: 2017-01-06 — End: 2017-01-06
  Filled 2017-01-06: qty 1

## 2017-01-06 MED ORDER — PHENOL 1.4 % MT LIQD
1.0000 | OROMUCOSAL | Status: DC | PRN
  Filled 2017-01-06: qty 177

## 2017-01-06 MED ORDER — MENTHOL 3 MG MT LOZG: 1.0000 | LOZENGE | OROMUCOSAL | Status: DC | PRN

## 2017-01-06 MED ORDER — TRANEXAMIC ACID 1000 MG/10ML IV SOLN
1000.0000 mg | Freq: Once | INTRAVENOUS | Status: AC
  Administered 2017-01-06: 1000 mg via INTRAVENOUS
  Filled 2017-01-06: qty 1100

## 2017-01-06 MED ORDER — ATORVASTATIN CALCIUM 10 MG PO TABS
10.0000 mg | ORAL_TABLET | Freq: Every day | ORAL | Status: DC
  Administered 2017-01-06 – 2017-01-07 (×2): 10 mg via ORAL
  Filled 2017-01-06 (×2): qty 1

## 2017-01-06 MED ORDER — METOCLOPRAMIDE HCL 5 MG PO TABS
5.0000 mg | ORAL_TABLET | Freq: Three times a day (TID) | ORAL | Status: DC | PRN
Start: 2017-01-06 — End: 2017-01-08

## 2017-01-06 MED ORDER — OXYCODONE HCL 5 MG PO TABS
ORAL_TABLET | ORAL | Status: AC
  Filled 2017-01-06: qty 1

## 2017-01-06 MED ORDER — ACETAMINOPHEN 650 MG RE SUPP: 650.0000 mg | Freq: Four times a day (QID) | RECTAL | Status: DC | PRN

## 2017-01-06 MED ORDER — MIDAZOLAM HCL 2 MG/2ML IJ SOLN
INTRAMUSCULAR | Status: AC
  Filled 2017-01-06: qty 2

## 2017-01-06 MED ORDER — BUPIVACAINE LIPOSOME 1.3 % IJ SUSP
20.0000 mL | Freq: Once | INTRAMUSCULAR | Status: DC
  Filled 2017-01-06: qty 20

## 2017-01-06 MED ORDER — FENTANYL CITRATE (PF) 100 MCG/2ML IJ SOLN
25.0000 ug | INTRAMUSCULAR | Status: DC | PRN
  Administered 2017-01-06 (×2): 50 ug via INTRAVENOUS

## 2017-01-06 MED ORDER — ROPIVACAINE HCL 7.5 MG/ML IJ SOLN
INTRAMUSCULAR | Status: AC
  Filled 2017-01-06: qty 20

## 2017-01-06 MED ORDER — STERILE WATER FOR IRRIGATION IR SOLN
Status: DC | PRN
  Administered 2017-01-06: 1000 mL

## 2017-01-06 MED ORDER — DOCUSATE SODIUM 100 MG PO CAPS
100.0000 mg | ORAL_CAPSULE | Freq: Two times a day (BID) | ORAL | Status: DC
  Administered 2017-01-06 – 2017-01-08 (×4): 100 mg via ORAL
  Filled 2017-01-06 (×4): qty 1

## 2017-01-06 MED ORDER — OXYCODONE HCL 5 MG/5ML PO SOLN: 5.0000 mg | Freq: Once | ORAL | Status: AC | PRN

## 2017-01-06 MED ORDER — METHOCARBAMOL 500 MG PO TABS
500.0000 mg | ORAL_TABLET | Freq: Four times a day (QID) | ORAL | Status: DC | PRN
  Administered 2017-01-07 – 2017-01-08 (×4): 500 mg via ORAL
  Filled 2017-01-06 (×4): qty 1

## 2017-01-06 MED ORDER — ONDANSETRON HCL 4 MG/2ML IJ SOLN: 4.0000 mg | Freq: Four times a day (QID) | INTRAMUSCULAR | Status: DC | PRN

## 2017-01-06 MED ORDER — SODIUM CHLORIDE 0.9 % IJ SOLN
INTRAMUSCULAR | Status: AC
  Filled 2017-01-06: qty 50

## 2017-01-06 MED ORDER — PROPOFOL 500 MG/50ML IV EMUL
INTRAVENOUS | Status: DC | PRN
  Administered 2017-01-06: 50 ug/kg/min via INTRAVENOUS

## 2017-01-06 MED ORDER — MORPHINE SULFATE (PF) 2 MG/ML IV SOLN: 1.0000 mg | INTRAVENOUS | Status: DC | PRN

## 2017-01-06 MED ORDER — ACETAMINOPHEN 10 MG/ML IV SOLN
INTRAVENOUS | Status: AC
  Filled 2017-01-06: qty 100

## 2017-01-06 MED ORDER — BUPIVACAINE HCL (PF) 0.25 % IJ SOLN
INTRAMUSCULAR | Status: AC
  Filled 2017-01-06: qty 30

## 2017-01-06 MED ORDER — PROPOFOL 10 MG/ML IV BOLUS
INTRAVENOUS | Status: DC | PRN
  Administered 2017-01-06: 40 mg via INTRAVENOUS

## 2017-01-06 MED ORDER — TRANEXAMIC ACID 1000 MG/10ML IV SOLN
1000.0000 mg | INTRAVENOUS | Status: AC
  Administered 2017-01-06: 1000 mg via INTRAVENOUS
  Filled 2017-01-06: qty 1100

## 2017-01-06 MED ORDER — PROPOFOL 10 MG/ML IV BOLUS
INTRAVENOUS | Status: AC
  Filled 2017-01-06: qty 60

## 2017-01-06 MED ORDER — HYDROMORPHONE HCL 1 MG/ML IJ SOLN
INTRAMUSCULAR | Status: AC
  Filled 2017-01-06: qty 1

## 2017-01-06 MED ORDER — LIDOCAINE 2% (20 MG/ML) 5 ML SYRINGE
INTRAMUSCULAR | Status: DC | PRN
  Administered 2017-01-06: 40 mg via INTRAVENOUS
  Administered 2017-01-06: 60 mg via INTRAVENOUS

## 2017-01-06 MED ORDER — FLUOXETINE HCL 20 MG PO CAPS
20.0000 mg | ORAL_CAPSULE | Freq: Every day | ORAL | Status: DC
  Administered 2017-01-07 – 2017-01-08 (×2): 20 mg via ORAL
  Filled 2017-01-06 (×2): qty 1

## 2017-01-06 MED ORDER — LACTATED RINGERS IV SOLN
INTRAVENOUS | Status: DC
  Administered 2017-01-06 (×2): via INTRAVENOUS

## 2017-01-06 MED ORDER — ACETAMINOPHEN 325 MG PO TABS
650.0000 mg | ORAL_TABLET | Freq: Four times a day (QID) | ORAL | Status: DC | PRN
  Administered 2017-01-07 – 2017-01-08 (×2): 650 mg via ORAL
  Filled 2017-01-06 (×2): qty 2

## 2017-01-06 MED ORDER — ONDANSETRON HCL 4 MG PO TABS: 4.0000 mg | ORAL_TABLET | Freq: Four times a day (QID) | ORAL | Status: DC | PRN

## 2017-01-06 MED ORDER — ROPIVACAINE HCL 7.5 MG/ML IJ SOLN
INTRAMUSCULAR | Status: DC | PRN
  Administered 2017-01-06: 20 mL via PERINEURAL

## 2017-01-06 MED ORDER — MIDAZOLAM HCL 2 MG/2ML IJ SOLN
1.0000 mg | Freq: Once | INTRAMUSCULAR | Status: AC
  Administered 2017-01-06: 1 mg via INTRAVENOUS

## 2017-01-06 MED ORDER — METOCLOPRAMIDE HCL 5 MG/ML IJ SOLN
5.0000 mg | Freq: Three times a day (TID) | INTRAMUSCULAR | Status: DC | PRN
Start: 2017-01-06 — End: 2017-01-08

## 2017-01-06 MED ORDER — METHOCARBAMOL 1000 MG/10ML IJ SOLN
500.0000 mg | Freq: Four times a day (QID) | INTRAVENOUS | Status: DC | PRN
  Administered 2017-01-06: 500 mg via INTRAVENOUS
  Filled 2017-01-06: qty 5
  Filled 2017-01-06: qty 550

## 2017-01-06 MED ORDER — DEXAMETHASONE SODIUM PHOSPHATE 10 MG/ML IJ SOLN
INTRAMUSCULAR | Status: AC
  Filled 2017-01-06: qty 1

## 2017-01-06 MED ORDER — LIDOCAINE 2% (20 MG/ML) 5 ML SYRINGE
INTRAMUSCULAR | Status: AC
  Filled 2017-01-06: qty 5

## 2017-01-06 MED ORDER — DEXAMETHASONE SODIUM PHOSPHATE 10 MG/ML IJ SOLN
10.0000 mg | Freq: Once | INTRAMUSCULAR | Status: AC
  Administered 2017-01-06: 10 mg via INTRAVENOUS

## 2017-01-06 MED ORDER — DIPHENHYDRAMINE HCL 12.5 MG/5ML PO ELIX
12.5000 mg | ORAL_SOLUTION | ORAL | Status: DC | PRN
Start: 2017-01-06 — End: 2017-01-08
  Administered 2017-01-06: 12.5 mg via ORAL
  Administered 2017-01-07: 25 mg via ORAL
  Filled 2017-01-06: qty 10
  Filled 2017-01-06: qty 5

## 2017-01-06 MED ORDER — TRAMADOL HCL 50 MG PO TABS
50.0000 mg | ORAL_TABLET | Freq: Four times a day (QID) | ORAL | Status: DC | PRN
  Administered 2017-01-07: 50 mg via ORAL
  Administered 2017-01-07 – 2017-01-08 (×2): 100 mg via ORAL
  Filled 2017-01-06: qty 2
  Filled 2017-01-06: qty 1
  Filled 2017-01-06 (×2): qty 2

## 2017-01-06 MED ORDER — BUPIVACAINE LIPOSOME 1.3 % IJ SUSP
20.0000 mL | Freq: Once | INTRAMUSCULAR | Status: AC
Start: 2017-01-06 — End: 2017-01-06
  Administered 2017-01-06: 20 mL
  Filled 2017-01-06: qty 20

## 2017-01-06 MED ORDER — HYDROMORPHONE HCL 1 MG/ML IJ SOLN
0.2500 mg | INTRAMUSCULAR | Status: DC | PRN
  Administered 2017-01-06 (×4): 0.25 mg via INTRAVENOUS

## 2017-01-06 MED ORDER — ACETAMINOPHEN 500 MG PO TABS
1000.0000 mg | ORAL_TABLET | Freq: Four times a day (QID) | ORAL | Status: AC
  Administered 2017-01-06 – 2017-01-07 (×2): 1000 mg via ORAL
  Administered 2017-01-07: 500 mg via ORAL
  Administered 2017-01-07: 1000 mg via ORAL
  Filled 2017-01-06 (×4): qty 2

## 2017-01-06 MED ORDER — FLEET ENEMA 7-19 GM/118ML RE ENEM: 1.0000 | ENEMA | Freq: Once | RECTAL | Status: DC | PRN

## 2017-01-06 MED ORDER — CEFAZOLIN SODIUM-DEXTROSE 2-4 GM/100ML-% IV SOLN
2.0000 g | Freq: Four times a day (QID) | INTRAVENOUS | Status: AC
  Administered 2017-01-06 – 2017-01-07 (×2): 2 g via INTRAVENOUS
  Filled 2017-01-06 (×2): qty 100

## 2017-01-06 MED ORDER — ONDANSETRON HCL 4 MG/2ML IJ SOLN
INTRAMUSCULAR | Status: DC | PRN
  Administered 2017-01-06: 4 mg via INTRAVENOUS

## 2017-01-06 MED ORDER — DEXAMETHASONE SODIUM PHOSPHATE 10 MG/ML IJ SOLN
10.0000 mg | Freq: Once | INTRAMUSCULAR | Status: AC
  Administered 2017-01-07: 10 mg via INTRAVENOUS
  Filled 2017-01-06: qty 1

## 2017-01-06 MED ORDER — ACETAMINOPHEN 10 MG/ML IV SOLN
1000.0000 mg | Freq: Once | INTRAVENOUS | Status: AC
Start: 2017-01-06 — End: 2017-01-06
  Administered 2017-01-06: 1000 mg via INTRAVENOUS

## 2017-01-06 MED ORDER — 0.9 % SODIUM CHLORIDE (POUR BTL) OPTIME
TOPICAL | Status: DC | PRN
  Administered 2017-01-06: 1000 mL

## 2017-01-06 MED ORDER — SODIUM CHLORIDE 0.9 % IR SOLN
Status: DC | PRN
  Administered 2017-01-06: 1000 mL

## 2017-01-06 MED ORDER — OXYCODONE HCL 5 MG PO TABS
5.0000 mg | ORAL_TABLET | Freq: Once | ORAL | Status: AC | PRN
  Administered 2017-01-06: 5 mg via ORAL

## 2017-01-06 MED ORDER — MIRABEGRON ER 50 MG PO TB24
50.0000 mg | ORAL_TABLET | Freq: Every day | ORAL | Status: DC
  Administered 2017-01-07 – 2017-01-08 (×2): 50 mg via ORAL
  Filled 2017-01-06 (×2): qty 1

## 2017-01-06 MED ORDER — RIVAROXABAN 10 MG PO TABS
10.0000 mg | ORAL_TABLET | Freq: Every day | ORAL | Status: DC
  Administered 2017-01-07 – 2017-01-08 (×2): 10 mg via ORAL
  Filled 2017-01-06 (×2): qty 1

## 2017-01-06 MED ORDER — BUPIVACAINE IN DEXTROSE 0.75-8.25 % IT SOLN
INTRATHECAL | Status: DC | PRN
  Administered 2017-01-06: 1.8 mL via INTRATHECAL

## 2017-01-06 MED ORDER — SODIUM CHLORIDE 0.9 % IJ SOLN
INTRAMUSCULAR | Status: DC | PRN
  Administered 2017-01-06: 30 mL

## 2017-01-06 SURGICAL SUPPLY — 52 items
BAG DECANTER FOR FLEXI CONT (MISCELLANEOUS) ×3 IMPLANT
BAG ZIPLOCK 12X15 (MISCELLANEOUS) ×3 IMPLANT
BANDAGE ACE 6X5 VEL STRL LF (GAUZE/BANDAGES/DRESSINGS) ×3 IMPLANT
BLADE SAG 18X100X1.27 (BLADE) ×3 IMPLANT
BLADE SAW SGTL 11.0X1.19X90.0M (BLADE) ×3 IMPLANT
BNDG CONFORM 6X.82 1P STRL (GAUZE/BANDAGES/DRESSINGS) ×3 IMPLANT
BOWL SMART MIX CTS (DISPOSABLE) ×3 IMPLANT
CAP KNEE TOTAL 3 SIGMA ×3 IMPLANT
CEMENT HV SMART SET (Cement) ×6 IMPLANT
CLOSURE WOUND 1/2 X4 (GAUZE/BANDAGES/DRESSINGS) ×2
CLOTH BEACON ORANGE TIMEOUT ST (SAFETY) ×3 IMPLANT
CUFF TOURN SGL QUICK 34 (TOURNIQUET CUFF) ×2
CUFF TRNQT CYL 34X4X40X1 (TOURNIQUET CUFF) ×1 IMPLANT
DECANTER SPIKE VIAL GLASS SM (MISCELLANEOUS) ×3 IMPLANT
DRAPE U-SHAPE 47X51 STRL (DRAPES) ×3 IMPLANT
DRSG ADAPTIC 3X8 NADH LF (GAUZE/BANDAGES/DRESSINGS) ×3 IMPLANT
DRSG PAD ABDOMINAL 8X10 ST (GAUZE/BANDAGES/DRESSINGS) ×3 IMPLANT
DURAPREP 26ML APPLICATOR (WOUND CARE) ×3 IMPLANT
ELECT REM PT RETURN 9FT ADLT (ELECTROSURGICAL) ×3
ELECTRODE REM PT RTRN 9FT ADLT (ELECTROSURGICAL) ×1 IMPLANT
EVACUATOR 1/8 PVC DRAIN (DRAIN) ×3 IMPLANT
GAUZE SPONGE 4X4 12PLY STRL (GAUZE/BANDAGES/DRESSINGS) ×3 IMPLANT
GLOVE BIO SURGEON STRL SZ7.5 (GLOVE) IMPLANT
GLOVE BIO SURGEON STRL SZ8 (GLOVE) ×3 IMPLANT
GLOVE BIOGEL PI IND STRL 6.5 (GLOVE) IMPLANT
GLOVE BIOGEL PI IND STRL 8 (GLOVE) ×1 IMPLANT
GLOVE BIOGEL PI INDICATOR 6.5 (GLOVE)
GLOVE BIOGEL PI INDICATOR 8 (GLOVE) ×2
GLOVE SURG SS PI 6.5 STRL IVOR (GLOVE) IMPLANT
GOWN STRL REUS W/TWL LRG LVL3 (GOWN DISPOSABLE) ×3 IMPLANT
GOWN STRL REUS W/TWL XL LVL3 (GOWN DISPOSABLE) IMPLANT
HANDPIECE INTERPULSE COAX TIP (DISPOSABLE) ×2
IMMOBILIZER KNEE 20 (SOFTGOODS) ×3
IMMOBILIZER KNEE 20 THIGH 36 (SOFTGOODS) ×1 IMPLANT
MANIFOLD NEPTUNE II (INSTRUMENTS) ×3 IMPLANT
NS IRRIG 1000ML POUR BTL (IV SOLUTION) ×3 IMPLANT
PACK TOTAL KNEE CUSTOM (KITS) ×3 IMPLANT
PAD ABD 8X10 STRL (GAUZE/BANDAGES/DRESSINGS) ×3 IMPLANT
PADDING CAST COTTON 6X4 STRL (CAST SUPPLIES) ×9 IMPLANT
POSITIONER SURGICAL ARM (MISCELLANEOUS) ×3 IMPLANT
SET HNDPC FAN SPRY TIP SCT (DISPOSABLE) ×1 IMPLANT
STRIP CLOSURE SKIN 1/2X4 (GAUZE/BANDAGES/DRESSINGS) ×4 IMPLANT
SUT MNCRL AB 4-0 PS2 18 (SUTURE) ×3 IMPLANT
SUT VIC AB 2-0 CT1 27 (SUTURE) ×6
SUT VIC AB 2-0 CT1 TAPERPNT 27 (SUTURE) ×3 IMPLANT
SUT VLOC 180 0 24IN GS25 (SUTURE) ×3 IMPLANT
SYR 50ML LL SCALE MARK (SYRINGE) ×3 IMPLANT
TAPE STRIPS DRAPE STRL (GAUZE/BANDAGES/DRESSINGS) ×3 IMPLANT
TRAY FOLEY W/METER SILVER 16FR (SET/KITS/TRAYS/PACK) ×3 IMPLANT
WATER STERILE IRR 1500ML POUR (IV SOLUTION) ×3 IMPLANT
WRAP KNEE MAXI GEL POST OP (GAUZE/BANDAGES/DRESSINGS) ×3 IMPLANT
YANKAUER SUCT BULB TIP 10FT TU (MISCELLANEOUS) ×3 IMPLANT

## 2017-01-06 NOTE — H&P (View-Only) (Signed)
Carol Doyle DOB: 1941/11/07 Single / Language: [name] / Race: White Female Date of Admission:  01/06/2017 CC:  Left knee pain History of Present Illness The patient is a 76 year old female who comes in for a preoperative History and Physical. The patient is scheduled for a left total knee arthroplasty to be performed by Dr. Dione Plover. Aluisio, MD at [facility] on 01-06-2017. The patient is a 76 year old female who presented for follow up of their knee. The patient is being followed for their bilateral knee pain. Symptoms reported include: pain. The patient feels that they are doing poorly and report their pain level to be mild to moderate. She states her left knee has gotten progressively worse. She has been wanted to get it fixed for a while but it was taking care of her elderly mother. Her mother recently passed away and then Symba is now at a stage where she can go ahead and get her knee fixed. The left knee is hurting at all times. It is limiting what she can and cannot do. She is having pain as well as significant functional difficulties. It has been going on for many years. She has had injections in the past without benefit. They have been treated conservatively in the past for the above stated problem and despite conservative measures, they continue to have progressive pain and severe functional limitations and dysfunction. They have failed non-operative management including home exercise, medications, and injections. It is felt that they would benefit from undergoing total joint replacement. Risks and benefits of the procedure have been discussed with the patient and they elect to proceed with surgery. There are no active contraindications to surgery such as ongoing infection or rapidly progressive neurological disease.  Problem List/Past Medical Primary osteoarthritis of both knees (M17.0)  Anxiety Disorder  Chronic Pain  Osteoarthritis  Urinary Tract Infection   Past History of Chronic Infections Urinary Incontinence  Impaired Hearing  Degenerative Disc Disease  Bursitis  Bilateral Hip Abdominal Incisional Hernia Right Abdomen   Allergies No Known Drug Allergies   Family History Cancer  Brother, Father, Sister. Cerebrovascular Accident  Mother. Diabetes Mellitus  Mother. Heart Disease  Mother. Hypertension  Mother. Osteoarthritis  Brother, Maternal Grandmother, Mother. Severe allergy  Maternal Grandmother, Mother.  Social History Children  2 Current drinker  08/07/2016: Currently drinks beer and wine only occasionally per week Current work status  retired Furniture conservator/restorer weekly; does other Living situation  live with parents Marital status  divorced No history of drug/alcohol rehab  Number of flights of stairs before winded  less than 1 Tobacco / smoke exposure  08/07/2016: no Tobacco use  Former smoker. 08/07/2016: smoke(d) 3 more pack(s) per day  Medication History TraMADol HCl (50MG  Tablet, Oral) Active. FLUoxetine HCl (40MG  Capsule, Oral) Active. Benadryl (25MG  Tablet, Oral) Active. Myrbetriq (50MG  Tablet ER 24HR, Oral) Active. Atorvastatin Calcium (10MG  Tablet, Oral) Active. Premarin (0.625MG /GM Cream, Vaginal) Active. Vitamin D3 (1000UNIT Tablet, Oral) Active.  Past Surgical History Dilation and Curettage of Uterus - Multiple  Hysterectomy  partial (non-cancerous) Kidney Removal  right Tonsillectomy    Review of Systems  General Not Present- Chills, Fatigue, Fever, Memory Loss, Night Sweats, Weight Gain and Weight Loss. Skin Not Present- Eczema, Hives, Itching, Lesions and Rash. HEENT Not Present- Dentures, Double Vision, Headache, Hearing Loss, Tinnitus and Visual Loss. Respiratory Not Present- Allergies, Chronic Cough, Coughing up blood, Shortness of breath at rest and Shortness of breath with exertion. Cardiovascular Not Present- Chest  Pain, Difficulty Breathing Lying  Down, Murmur, Palpitations, Racing/skipping heartbeats and Swelling. Gastrointestinal Not Present- Abdominal Pain, Bloody Stool, Constipation, Diarrhea, Difficulty Swallowing, Heartburn, Jaundice, Loss of appetitie, Nausea and Vomiting. Female Genitourinary Not Present- Blood in Urine, Discharge, Flank Pain, Incontinence, Painful Urination, Urgency, Urinary frequency, Urinary Retention, Urinating at Night and Weak urinary stream. Musculoskeletal Present- Joint Pain and Morning Stiffness. Not Present- Back Pain, Joint Swelling, Muscle Pain, Muscle Weakness and Spasms. Neurological Not Present- Blackout spells, Difficulty with balance, Dizziness, Paralysis, Tremor and Weakness. Psychiatric Not Present- Insomnia.  Vitals  Weight: 251 lb Height: 67in Body Surface Area: 2.23 m Body Mass Index: 39.31 kg/m  Pulse: 76 (Regular)  BP: 146/82 (Sitting, Right Arm, Standard)   Physical Exam  General Mental Status -Alert, cooperative and good historian. General Appearance-pleasant, Not in acute distress. Orientation-Oriented X3. Build & Nutrition-Well nourished and Well developed.  Head and Neck Head-normocephalic, atraumatic . Neck Global Assessment - supple, no bruit auscultated on the right, no bruit auscultated on the left.  Eye Vision-Wears corrective lenses. Pupil - Bilateral-Regular and Round. Motion - Bilateral-EOMI.  Chest and Lung Exam Auscultation Breath sounds - clear at anterior chest wall and clear at posterior chest wall. Adventitious sounds - No Adventitious sounds.  Cardiovascular Auscultation Rhythm - Regular rate and rhythm. Heart Sounds - S1 WNL and S2 WNL. Murmurs & Other Heart Sounds - Auscultation of the heart reveals - No Murmurs.  Abdomen Inspection Hernias - Hernias - Incisional - Note: incisional hernia right lower abdomen. Contour - Generalized moderate distention. Incisional scars - Laparotomy scar(Right lower  abdomen). Palpation/Percussion Tenderness - Abdomen is non-tender to palpation. Rigidity (guarding) - Abdomen is soft. Auscultation Auscultation of the abdomen reveals - Bowel sounds normal.  Female Genitourinary Note: Not done, not pertinent to present illness   Musculoskeletal Note: On exam, well-developed female in no distress. Her hips show normal range of motion, no discomfort. Her left knee shows no effusion. Her range about 5 to 125. There is marked crepitus on range of motion with tenderness medial greater than lateral and no instability noted. Right knee has 0 to 135, moderate crepitus on range of motion, slight tenderness medial greater than lateral and no instability.  Her x-rays of left knee show bone-on-bone arthritis in the medial and patellofemoral compartments.   Assessment & Plan Primary osteoarthritis of left knee (M17.12)  Note:Surgical Plans: Left Total Knee Replacement  Disposition: Home and plans to do in home Virtual Therapy utilizing the [facility] [place].  PCP: Dr. Manuella Ghazi - Pending at time of H&P  IV TXA  Anesthesia Issues: None  Patient was instructed on what medications to stop prior to surgery.  Signed electronically by Ok Edwards, III PA-C

## 2017-01-06 NOTE — Anesthesia Procedure Notes (Signed)
Anesthesia Regional Block:  Adductor canal block  Pre-Anesthetic Checklist: ,, timeout performed, Correct Patient, Correct Site, Correct Laterality, Correct Procedure, Correct Position, site marked, Risks and benefits discussed,  Surgical consent,  Pre-op evaluation,  At surgeon's request and post-op pain management  Laterality: Left  Prep: chloraprep       Needles:  Injection technique: Single-shot  Needle Type: Echogenic Needle     Needle Length: 9cm 9 cm Needle Gauge: 21 and 21 G    Additional Needles:  Procedures: ultrasound guided (picture in chart) Adductor canal block Narrative:  Start time: 01/06/2017 12:25 PM End time: 01/06/2017 12:31 PM Injection made incrementally with aspirations every 5 mL.  Performed by: Personally  Anesthesiologist: Sireen Halk  Additional Notes: Pt tolerated the procedure well.

## 2017-01-06 NOTE — Anesthesia Procedure Notes (Signed)
Procedure Name: MAC Date/Time: 01/06/2017 12:55 PM Performed by: Dione Booze Pre-anesthesia Checklist: Patient identified, Emergency Drugs available, Patient being monitored and Suction available Patient Re-evaluated:Patient Re-evaluated prior to inductionOxygen Delivery Method: Simple face mask Placement Confirmation: positive ETCO2

## 2017-01-06 NOTE — Op Note (Signed)
OPERATIVE REPORT-TOTAL KNEE ARTHROPLASTY   Pre-operative diagnosis- Osteoarthritis  Left knee(s)  Post-operative diagnosis- Osteoarthritis Left knee(s)  Procedure-  Left  Total Knee Arthroplasty  Surgeon- Carol Plover. Moya Duan, MD  Assistant- Arlee Muslim, PA-C   Anesthesia-  Adductor canal block and spinal  EBL-* No blood loss amount entered *   Drains Hemovac  Tourniquet time-  Total Tourniquet Time Documented: Thigh (Left) - 36 minutes Total: Thigh (Left) - 36 minutes     Complications- None  Condition-PACU - hemodynamically stable.   Brief Clinical Note  Carol Doyle is a 76 y.o. year old female with end stage OA of her left knee with progressively worsening pain and dysfunction. She has constant pain, with activity and at rest and significant functional deficits with difficulties even with ADLs. She has had extensive non-op management including analgesics, injections of cortisone and viscosupplements, and home exercise program, but remains in significant pain with significant dysfunction. Radiographs show bone on bone arthritis medial and patellofemoral. She presents now for left Total Knee Arthroplasty.    Procedure in detail---   The patient is brought into the operating room and positioned supine on the operating table. After successful administration of  Adductor canal block and spinal,   a tourniquet is placed high on the  Left thigh(s) and the lower extremity is prepped and draped in the usual sterile fashion. Time out is performed by the operating team and then the  Left lower extremity is wrapped in Esmarch, knee flexed and the tourniquet inflated to 300 mmHg.       A midline incision is made with a ten blade through the subcutaneous tissue to the level of the extensor mechanism. A fresh blade is used to make a medial parapatellar arthrotomy. Soft tissue over the proximal medial tibia is subperiosteally elevated to the joint line with a knife and into the  semimembranosus bursa with a Cobb elevator. Soft tissue over the proximal lateral tibia is elevated with attention being paid to avoiding the patellar tendon on the tibial tubercle. The patella is everted, knee flexed 90 degrees and the ACL and PCL are removed. Findings are bone on bone medial and patellofemoral with massive global osteophytes.        The drill is used to create a starting hole in the distal femur and the canal is thoroughly irrigated with sterile saline to remove the fatty contents. The 5 degree Left  valgus alignment guide is placed into the femoral canal and the distal femoral cutting block is pinned to remove 10 mm off the distal femur. Resection is made with an oscillating saw.      The tibia is subluxed forward and the menisci are removed. The extramedullary alignment guide is placed referencing proximally at the medial aspect of the tibial tubercle and distally along the second metatarsal axis and tibial crest. The block is pinned to remove 64mm off the more deficient medial  side. Resection is made with an oscillating saw. Size 3is the most appropriate size for the tibia and the proximal tibia is prepared with the modular drill and keel punch for that size.      The femoral sizing guide is placed and size 4 is most appropriate. Rotation is marked off the epicondylar axis and confirmed by creating a rectangular flexion gap at 90 degrees. The size 4 cutting block is pinned in this rotation and the anterior, posterior and chamfer cuts are made with the oscillating saw. The intercondylar block is then placed and that  cut is made.      Trial size 3 tibial component, trial size 4 posterior stabilized femur and a 12.5  mm posterior stabilized rotating platform insert trial is placed. Full extension is achieved with excellent varus/valgus and anterior/posterior balance throughout full range of motion. The patella is everted and thickness measured to be 24  mm. Free hand resection is taken to 14  mm, a 38 template is placed, lug holes are drilled, trial patella is placed, and it tracks normally. Osteophytes are removed off the posterior femur with the trial in place. All trials are removed and the cut bone surfaces prepared with pulsatile lavage. Cement is mixed and once ready for implantation, the size 3 tibial implant, size  4 posterior stabilized femoral component, and the size 38 patella are cemented in place and the patella is held with the clamp. The trial insert is placed and the knee held in full extension. The Exparel (20 ml mixed with 30 ml saline) is injected into the extensor mechanism, posterior capsule, medial and lateral gutters and subcutaneous tissues.  All extruded cement is removed and once the cement is hard the permanent 12.5 mm posterior stabilized rotating platform insert is placed into the tibial tray.      The wound is copiously irrigated with saline solution and the extensor mechanism closed over a hemovac drain with #1 V-loc suture. The tourniquet is released for a total tourniquet time of 36  minutes. Flexion against gravity is 140 degrees and the patella tracks normally. Subcutaneous tissue is closed with 2.0 vicryl and subcuticular with running 4.0 Monocryl. The incision is cleaned and dried and steri-strips and a bulky sterile dressing are applied. The limb is placed into a knee immobilizer and the patient is awakened and transported to recovery in stable condition.      Please note that a surgical assistant was a medical necessity for this procedure in order to perform it in a safe and expeditious manner. Surgical assistant was necessary to retract the ligaments and vital neurovascular structures to prevent injury to them and also necessary for proper positioning of the limb to allow for anatomic placement of the prosthesis.   Carol Plover Pearly Bartosik, MD    01/06/2017, 1:54 PM

## 2017-01-06 NOTE — Progress Notes (Signed)
AssistedDr. Marcie Bal with left, ultrasound guided, adductor canal block. Side rails up, monitors on throughout procedure. See vital signs in flow sheet. Tolerated Procedure well.

## 2017-01-06 NOTE — Transfer of Care (Signed)
Immediate Anesthesia Transfer of Care Note  Patient: Carol Doyle  Procedure(s) Performed: Procedure(s): LEFT TOTAL KNEE ARTHROPLASTY (Left)  Patient Location: PACU  Anesthesia Type:MAC, Regional and Spinal  Level of Consciousness: awake, alert , oriented and patient cooperative  Airway & Oxygen Therapy: Patient Spontanous Breathing and Patient connected to face mask oxygen  Post-op Assessment: Report given to RN and Post -op Vital signs reviewed and stable  Post vital signs: Reviewed and stable  Last Vitals:  Vitals:   01/06/17 1242 01/06/17 1243  BP:    Pulse: 65 67  Resp: 11 10  Temp:      Last Pain:  Vitals:   01/06/17 1106  TempSrc: Oral  PainSc:       Patients Stated Pain Goal: 4 (81/01/75 1025)  Complications: No apparent anesthesia complications

## 2017-01-06 NOTE — Anesthesia Postprocedure Evaluation (Signed)
Anesthesia Post Note  Patient: Carol Doyle  Procedure(s) Performed: Procedure(s) (LRB): LEFT TOTAL KNEE ARTHROPLASTY (Left)  Patient location during evaluation: PACU Anesthesia Type: Spinal Level of consciousness: oriented and awake and alert Pain management: pain level controlled Vital Signs Assessment: post-procedure vital signs reviewed and stable Respiratory status: spontaneous breathing, respiratory function stable and patient connected to nasal cannula oxygen Cardiovascular status: blood pressure returned to baseline and stable Postop Assessment: no headache and no backache Anesthetic complications: no       Last Vitals:  Vitals:   01/06/17 1500 01/06/17 1515  BP: 133/67 (!) 130/54  Pulse: 69 72  Resp: 19 13  Temp:      Last Pain:  Vitals:   01/06/17 1515  TempSrc:   PainSc: [place]

## 2017-01-06 NOTE — Anesthesia Preprocedure Evaluation (Signed)
Anesthesia Evaluation  Patient identified by MRN, date of birth, ID band Patient awake    Reviewed: Allergy & Precautions, H&P , NPO status , Patient's Chart, lab work & pertinent test results  Airway Mallampati: II   Neck ROM: full    Dental   Pulmonary former smoker,    breath sounds clear to auscultation       Cardiovascular negative cardio ROS   Rhythm:regular Rate:Normal     Neuro/Psych Anxiety    GI/Hepatic hiatal hernia,   Endo/Other    Renal/GU      Musculoskeletal  (+) Arthritis ,   Abdominal   Peds  Hematology   Anesthesia Other Findings   Reproductive/Obstetrics                             Anesthesia Physical Anesthesia Plan  ASA: II  Anesthesia Plan: Spinal   Post-op Pain Management:  Regional for Post-op pain   Induction: Intravenous  Airway Management Planned: Simple Face Mask  Additional Equipment:   Intra-op Plan:   Post-operative Plan:   Informed Consent: I have reviewed the patients History and Physical, chart, labs and discussed the procedure including the risks, benefits and alternatives for the proposed anesthesia with the patient or authorized representative who has indicated his/her understanding and acceptance.     Plan Discussed with: CRNA, Anesthesiologist and Surgeon  Anesthesia Plan Comments:         Anesthesia Quick Evaluation

## 2017-01-06 NOTE — Anesthesia Procedure Notes (Signed)
Spinal  Patient location during procedure: OR Start time: 01/06/2017 12:50 PM End time: 01/06/2017 12:55 PM Staffing Anesthesiologist: Marcie Bal, Thinh Cuccaro Performed: anesthesiologist  Preanesthetic Checklist Completed: patient identified, site marked, surgical consent, pre-op evaluation, timeout performed, IV checked, risks and benefits discussed and monitors and equipment checked Spinal Block Patient position: sitting Prep: Betadine Patient monitoring: heart rate, cardiac monitor, continuous pulse ox and blood pressure Approach: midline Location: L3-4 Injection technique: single-shot Needle Needle type: Pencan  Needle gauge: 24 G Needle length: 9 cm Assessment Sensory level: T10 Additional Notes Pt tolerated the procedure well.

## 2017-01-06 NOTE — Interval H&P Note (Signed)
History and Physical Interval Note:  01/06/2017 12:41 PM  [name]  has presented today for surgery, with the diagnosis of LEFT KNEE OA  The various methods of treatment have been discussed with the patient and family. After consideration of risks, benefits and other options for treatment, the patient has consented to  Procedure(s): LEFT TOTAL KNEE ARTHROPLASTY (Left) as a surgical intervention .  The patient's history has been reviewed, patient examined, no change in status, stable for surgery.  I have reviewed the patient's chart and labs.  Questions were answered to the patient's satisfaction.     Carol Doyle

## 2017-01-07 LAB — CBC
HEMATOCRIT: 36.3 % (ref 36.0–46.0)
Hemoglobin: 12.3 g/dL (ref 12.0–15.0)
MCH: 29.9 pg (ref 26.0–34.0)
MCHC: 33.9 g/dL (ref 30.0–36.0)
MCV: 88.1 fL (ref 78.0–100.0)
Platelets: 255 10*3/uL (ref 150–400)
RBC: 4.12 MIL/uL (ref 3.87–5.11)
RDW: 13.8 % (ref 11.5–15.5)
WBC: 15 10*3/uL — AB (ref 4.0–10.5)

## 2017-01-07 LAB — BASIC METABOLIC PANEL
ANION GAP: 5 (ref 5–15)
BUN: 13 mg/dL (ref 6–20)
CALCIUM: 8 mg/dL — AB (ref 8.9–10.3)
CO2: 27 mmol/L (ref 22–32)
Chloride: 103 mmol/L (ref 101–111)
Creatinine, Ser: 0.95 mg/dL (ref 0.44–1.00)
GFR, EST NON AFRICAN AMERICAN: 58 mL/min — AB (ref >60)
Glucose, Bld: 139 mg/dL — ABNORMAL HIGH (ref 65–99)
Potassium: 4.9 mmol/L (ref 3.5–5.1)
SODIUM: 135 mmol/L (ref 135–145)

## 2017-01-07 LAB — GLUCOSE, CAPILLARY: GLUCOSE-CAPILLARY: 161 mg/dL — AB (ref 65–99)

## 2017-01-07 MED ORDER — OXYCODONE HCL 5 MG PO TABS: 5.0000 mg | ORAL_TABLET | ORAL | 0 refills | Status: DC | PRN

## 2017-01-07 MED ORDER — METHOCARBAMOL 500 MG PO TABS: 500.0000 mg | ORAL_TABLET | Freq: Four times a day (QID) | ORAL | 0 refills | Status: DC | PRN

## 2017-01-07 MED ORDER — TRAMADOL HCL 50 MG PO TABS: 50.0000 mg | ORAL_TABLET | Freq: Four times a day (QID) | ORAL | 0 refills | Status: DC | PRN

## 2017-01-07 MED ORDER — RIVAROXABAN 10 MG PO TABS: 10.0000 mg | ORAL_TABLET | Freq: Every day | ORAL | 0 refills | Status: DC

## 2017-01-07 NOTE — Progress Notes (Signed)
PT Cancellation Note  Patient Details Name: Carol Doyle MRN: [id number] DOB: January 09, 1941   Cancelled Treatment:    Reason Eval/Treat Not Completed: Pain limiting ability to participate;Fatigue/lethargy limiting ability to participatedeclined second visit.   Claretha Cooper 01/07/2017, 5:21 PM

## 2017-01-07 NOTE — Evaluation (Signed)
Occupational Therapy Evaluation Patient Details Name: Carol Doyle MRN: [id number] DOB: [birth year] Today's Date: 01/07/2017    History of Present Illness LTKA   Clinical Impression   This 76 year old female was admitted for the above sx. She will benefit from one more session of OT to further educate on bathroom transfers. Goals are for min guard level    Follow Up Recommendations  Supervision/Assistance - 24 hour    Equipment Recommendations  3 in 1 bedside commode    Recommendations for Other Services       Precautions / Restrictions Precautions Precautions: Fall;Knee Precaution Comments: BP has been low Required Braces or Orthoses: Knee Immobilizer - Left Knee Immobilizer - Left: Discontinue once straight leg raise with < 10 degree lag Restrictions Weight Bearing Restrictions: No      Mobility Bed Mobility Overal bed mobility: Needs Assistance Bed Mobility: Supine to Sit     Supine to sit: Min assist     General bed mobility comments: oob by PT  Transfers Overall transfer level: Needs assistance Equipment used: Rolling walker (2 wheeled) Transfers: Sit to/from Stand Sit to Stand: Min guard         General transfer comment: cues for UE/LE Placement    Balance                                            ADL Overall ADL's : Needs assistance/impaired     Grooming: Supervision/safety;Standing       Lower Body Bathing: Sit to/from stand;Minimal assistance       Lower Body Dressing: Maximal assistance;Sit to/from stand   Toilet Transfer: Min guard;Ambulation;RW (chair)   Toileting- Clothing Manipulation and Hygiene: Min guard;Sit to/from stand         General ADL Comments: pt has a long sponge. She is able to perform UB adls with set up.  Pt's daughter will assist as needed.  Recommend 3:1 for shower and over toilet, if needed     Vision     Perception     Praxis      Pertinent Vitals/Pain Pain Assessment:  0-10 Pain Score: 5  Pain Location: L knee Pain Descriptors / Indicators: Aching Pain Intervention(s): Limited activity within patient's tolerance;Monitored during session;Premedicated before session;Repositioned     Hand Dominance     Extremity/Trunk Assessment Upper Extremity Assessment Upper Extremity Assessment: Overall WFL for tasks assessed      Cervical / Trunk Assessment Cervical / Trunk Assessment: Normal   Communication Communication Communication: No difficulties   Cognition Arousal/Alertness: Awake/alert Behavior During Therapy: WFL for tasks assessed/performed Overall Cognitive Status: Within Functional Limits for tasks assessed:  Pt was a little tangential during conversation                     General Comments       Exercises       Shoulder Instructions      Home Living Family/patient expects to be discharged to:: Other (Comment) Living Arrangements: Children Available Help at Discharge: Family;Available PRN/intermittently Type of Home: Mobile home Home Access: Stairs to enter Entrance Stairs-Number of Steps: 6 Entrance Stairs-Rails: Left (another rail at the top) Home Layout: One level     Bathroom Shower/Tub: Occupational psychologist: Standard     Home Equipment: Walker - standard;Bedside commode;Cane - single point   Additional Comments: the patient  reports that the SW may not fir through the door  at bed and bathroom..  Pt's toilet is on a step.  She is staying in an RV/motor coach      Prior Functioning/Environment Level of Independence: Independent                 OT Problem List: Decreased activity tolerance;Decreased knowledge of use of DME or AE;Pain   OT Treatment/Interventions: Self-care/ADL training;DME and/or AE instruction;Patient/family education    OT Goals(Current goals can be found in the care plan section) Acute Rehab OT Goals Patient Stated Goal: to go  to my motor coach OT Goal Formulation: With  patient Time For Goal Achievement: 01/14/17 Potential to Achieve Goals: Good ADL Goals Pt Will Transfer to Toilet: with supervision;ambulating;bedside commode (vs high commode with RW) Pt Will Perform Tub/Shower Transfer: Shower transfer;with min guard assist;ambulating;3 in 1  OT Frequency: Min 2X/week   Barriers to D/C:            Co-evaluation              End of Session CPM Left Knee CPM Left Knee: Off  Activity Tolerance: Patient tolerated treatment well Patient left: in chair;with call bell/phone within reach   Time: [id number] OT Time Calculation (min): 21 min Charges:  OT General Charges $OT Visit: 1 Procedure OT Evaluation $OT Eval Low Complexity: 1 Procedure G-Codes:    Carol Doyle 01/15/17, 11:24 AM  Lesle Chris, OTR/L [phone] 01/15/2017

## 2017-01-07 NOTE — Discharge Instructions (Addendum)
° °Dr. Frank Aluisio °Total Joint Specialist °[facility] °[address] °[place], [place] [address] °[phone] ° °TOTAL KNEE REPLACEMENT POSTOPERATIVE DIRECTIONS ° °Knee Rehabilitation, Guidelines Following Surgery  °Results after knee surgery are often greatly improved when you follow the exercise, range of motion and muscle strengthening exercises prescribed by your doctor. Safety measures are also important to protect the knee from further injury. Any time any of these exercises cause you to have increased pain or swelling in your knee joint, decrease the amount until you are comfortable again and slowly increase them. If you have problems or questions, call your caregiver or physical therapist for advice.  ° °HOME CARE INSTRUCTIONS  °Remove items at home which could result in a fall. This includes throw rugs or furniture in walking pathways.  °· ICE to the affected knee every three hours for 30 minutes at a time and then as needed for pain and swelling.  Continue to use ice on the knee for pain and swelling from surgery. You may notice swelling that will progress down to the foot and ankle.  This is normal after surgery.  Elevate the leg when you are not up walking on it.   °· Continue to use the breathing machine which will help keep your temperature down.  It is common for your temperature to cycle up and down following surgery, especially at night when you are not up moving around and exerting yourself.  The breathing machine keeps your lungs expanded and your temperature down. °· Do not place pillow under knee, focus on keeping the knee straight while resting ° °DIET °You may resume your previous home diet once your are discharged from the hospital. ° °DRESSING / WOUND CARE / SHOWERING °You may shower 3 days after surgery, but keep the wounds dry during showering.  You may use an occlusive plastic wrap (Press'n Seal for example), NO SOAKING/SUBMERGING IN THE BATHTUB.  If the  bandage gets wet, change with a clean dry gauze.  If the incision gets wet, pat the wound dry with a clean towel. °You may start showering once you are discharged home but do not submerge the incision under water. Just pat the incision dry and apply a dry gauze dressing on daily. °Change the surgical dressing daily and reapply a dry dressing each time. ° °ACTIVITY °Walk with your walker as instructed. °Use walker as long as suggested by your caregivers. °Avoid periods of inactivity such as sitting longer than an hour when not asleep. This helps prevent blood clots.  °You may resume a sexual relationship in one month or when given the OK by your doctor.  °You may return to work once you are cleared by your doctor.  °Do not drive a car for 6 weeks or until released by you surgeon.  °Do not drive while taking narcotics. ° °WEIGHT BEARING °Weight bearing as tolerated with assist device (walker, cane, etc) as directed, use it as long as suggested by your surgeon or therapist, typically at least 4-6 weeks. ° °POSTOPERATIVE CONSTIPATION PROTOCOL °Constipation - defined medically as fewer than three stools per week and severe constipation as less than one stool per week. ° °One of the most common issues patients have following surgery is constipation.  Even if you have a regular bowel pattern at home, your normal regimen is likely to be disrupted due to multiple reasons following surgery.  Combination of anesthesia, postoperative narcotics, change in appetite and fluid intake all can affect your bowels.    In order to avoid complications following surgery, here are some recommendations in order to help you during your recovery period. ° °Colace (docusate) - Pick up an over-the-counter form of Colace or another stool softener and take twice a day as long as you are requiring postoperative pain medications.  Take with a full glass of water daily.  If you experience loose stools or diarrhea, hold the colace until you stool forms  back up.  If your symptoms do not get better within 1 week or if they get worse, check with your doctor. ° °Dulcolax (bisacodyl) - Pick up over-the-counter and take as directed by the product packaging as needed to assist with the movement of your bowels.  Take with a full glass of water.  Use this product as needed if not relieved by Colace only.  ° °MiraLax (polyethylene glycol) - Pick up over-the-counter to have on hand.  MiraLax is a solution that will increase the amount of water in your bowels to assist with bowel movements.  Take as directed and can mix with a glass of water, juice, soda, coffee, or tea.  Take if you go more than two days without a movement. °Do not use MiraLax more than once per day. Call your doctor if you are still constipated or irregular after using this medication for 7 days in a row. ° °If you continue to have problems with postoperative constipation, please contact the office for further assistance and recommendations.  If you experience "the worst abdominal pain ever" or develop nausea or vomiting, please contact the office immediatly for further recommendations for treatment. ° °ITCHING ° If you experience itching with your medications, try taking only a single pain pill, or even half a pain pill at a time.  You can also use Benadryl over the counter for itching or also to help with sleep.  ° °TED HOSE STOCKINGS °Wear the elastic stockings on both legs for three weeks following surgery during the day but you may remove then at night for sleeping. ° °MEDICATIONS °See your medication summary on the “After Visit Summary” that the nursing staff will review with you prior to discharge.  You may have some home medications which will be placed on hold until you complete the course of blood thinner medication.  It is important for you to complete the blood thinner medication as prescribed by your surgeon.  Continue your approved medications as instructed at time of  discharge. ° °PRECAUTIONS °If you experience chest pain or shortness of breath - call 911 immediately for transfer to the hospital emergency department.  °If you develop a fever greater that 101 F, purulent drainage from wound, increased redness or drainage from wound, foul odor from the wound/dressing, or calf pain - CONTACT YOUR SURGEON.   °                                                °FOLLOW-UP APPOINTMENTS °Make sure you keep all of your appointments after your operation with your surgeon and caregivers. You should call the office at the above phone number and make an appointment for approximately two weeks after the date of your surgery or on the date instructed by your surgeon outlined in the "After Visit Summary". ° ° °RANGE OF MOTION AND STRENGTHENING EXERCISES  °Rehabilitation of the knee is important following a knee injury or   an operation. After just a few days of immobilization, the muscles of the thigh which control the knee become weakened and shrink (atrophy). Knee exercises are designed to build up the tone and strength of the thigh muscles and to improve knee motion. Often times heat used for twenty to thirty minutes before working out will loosen up your tissues and help with improving the range of motion but do not use heat for the first two weeks following surgery. These exercises can be done on a training (exercise) mat, on the floor, on a table or on a bed. Use what ever works the best and is most comfortable for you Knee exercises include:  Leg Lifts - While your knee is still immobilized in a splint or cast, you can do straight leg raises. Lift the leg to 60 degrees, hold for 3 sec, and slowly lower the leg. Repeat 10-20 times 2-3 times daily. Perform this exercise against resistance later as your knee gets better.  Quad and Hamstring Sets - Tighten up the muscle on the front of the thigh (Quad) and hold for 5-10 sec. Repeat this 10-20 times hourly. Hamstring sets are done by pushing the  foot backward against an object and holding for 5-10 sec. Repeat as with quad sets.   Leg Slides: Lying on your back, slowly slide your foot toward your buttocks, bending your knee up off the floor (only go as far as is comfortable). Then slowly slide your foot back down until your leg is flat on the floor again.  Angel Wings: Lying on your back spread your legs to the side as far apart as you can without causing discomfort.  A rehabilitation program following serious knee injuries can speed recovery and prevent re-injury in the future due to weakened muscles. Contact your doctor or a physical therapist for more information on knee rehabilitation.    MAKE SURE YOU:  Understand these instructions.  Get help right away if you are not doing well or get worse.    Pick up stool softner and laxative for home use following surgery while on pain medications. Do not submerge incision under water. Please use good hand washing techniques while changing dressing each day. May shower starting three days after surgery. Please use a clean towel to pat the incision dry following showers. Continue to use ice for pain and swelling after surgery. Do not use any lotions or creams on the incision until instructed by your surgeon.  Take Xarelto for two and a half more weeks following discharge from the hospital, then discontinue Xarelto. Once the patient has completed the blood thinner regimen, then take a Baby 81 mg Aspirin daily for three more weeks.     Information on my medicine - XARELTO (Rivaroxaban)  This medication education was reviewed with me or my healthcare representative as part of my discharge preparation.  The pharmacist that spoke with me during my hospital stay was:  [facility] Tylyn Derwin, Student-PharmD  Why was Xarelto prescribed for you? Xarelto was prescribed for you to reduce the risk of blood clots forming after orthopedic surgery. The medical term for these abnormal blood clots is  venous thromboembolism (VTE).  What do you need to know about xarelto ? Take your Xarelto ONCE DAILY at the same time every day. You may take it either with or without food.  If you have difficulty swallowing the tablet whole, you may crush it and mix in applesauce just prior to taking your dose.  Take Xarelto exactly as  prescribed by your doctor and DO NOT stop taking Xarelto without talking to the doctor who prescribed the medication.  Stopping without other VTE prevention medication to take the place of Xarelto may increase your risk of developing a clot.  After discharge, you should have regular check-up appointments with your healthcare provider that is prescribing your Xarelto.    What do you do if you miss a dose? If you miss a dose, take it as soon as you remember on the same day then continue your regularly scheduled once daily regimen the next day. Do not take two doses of Xarelto on the same day.   Important Safety Information A possible side effect of Xarelto is bleeding. You should call your healthcare provider right away if you experience any of the following: ? Bleeding from an injury or your nose that does not stop. ? Unusual colored urine (red or dark brown) or unusual colored stools (red or black). ? Unusual bruising for unknown reasons. ? A serious fall or if you hit your head (even if there is no bleeding).  Some medicines may interact with Xarelto and might increase your risk of bleeding while on Xarelto. To help avoid this, consult your healthcare provider or pharmacist prior to using any new prescription or non-prescription medications, including herbals, vitamins, non-steroidal anti-inflammatory drugs (NSAIDs) and supplements.  This website has more information on Xarelto: [email or web address]

## 2017-01-07 NOTE — Progress Notes (Signed)
   Subjective: 1 Day Post-Op Procedure(s) (LRB): LEFT TOTAL KNEE ARTHROPLASTY (Left) Patient reports pain as mild and moderate.   Patient seen in rounds by Dr. Wynelle Link. Patient is well, but has had some minor complaints of pain in the knee, requiring pain medications We will start therapy today.  Plan is to go Home after hospital stay.  Objective: Vital signs in last 24 hours: Temp:  [97.4 F (36.3 C)-98.2 F (36.8 C)] 97.6 F (36.4 C) (01/30 0526) Pulse Rate:  [62-81] 66 (01/30 0526) Resp:  [8-28] 15 (01/30 0526) BP: (113-139)/(54-88) 113/68 (01/30 0526) SpO2:  [95 %-100 %] 96 % (01/30 0526) Weight:  [115.2 kg (254 lb)] 115.2 kg (254 lb) (01/29 1020)  Intake/Output from previous day:  Intake/Output Summary (Last 24 hours) at 01/07/17 0800 Last data filed at 01/07/17 0600  Gross per 24 hour  Intake             3360 ml  Output             1380 ml  Net             1980 ml    Intake/Output this shift: No intake/output data recorded.  Labs:  Recent Labs  01/07/17 0426  HGB 12.3    Recent Labs  01/07/17 0426  WBC 15.0*  RBC 4.12  HCT 36.3  PLT 255    Recent Labs  01/07/17 0426  NA 135  K 4.9  CL 103  CO2 27  BUN 13  CREATININE 0.95  GLUCOSE 139*  CALCIUM 8.0*   No results for input(s): LABPT, INR in the last 72 hours.  EXAM General - Patient is Alert, Appropriate and Oriented Extremity - Neurovascular intact Sensation intact distally Dressing - dressing C/D/I Motor Function - intact, moving foot and toes well on exam.  Hemovac pulled without difficulty.  Past Medical History:  Diagnosis Date  . Anemia    blood transfusion "many years ago before 1970"  . Anxiety   . Arthritis   . Cancer [facility])    kidney cancer  . History of hiatal hernia 2009   incisional hernia  . Pneumonia    years ago, possibly age 34  . Pre-diabetes     Assessment/Plan: 1 Day Post-Op Procedure(s) (LRB): LEFT TOTAL KNEE ARTHROPLASTY (Left) Principal Problem:   OA  (osteoarthritis) of knee  Estimated body mass index is 39.78 kg/m as calculated from the following:   Height as of this encounter: 5\' 7"  (1.702 m).   Weight as of this encounter: 115.2 kg (254 lb). Up with therapy Discharge home - Home and plans to do in home Virtual Therapy utilizing the [facility].  DVT Prophylaxis - Xarelto Weight-Bearing as tolerated to left leg D/C O2 and Pulse OX and try on Room Air  Patient is currently day one from total knee replacement.  She has not been up yet with therapy.  Will start therapy today and hopefully be ready to go home tomorrow. She plans to start with virtual therapy at home following discharge.  Arlee Muslim, PA-C Orthopaedic Surgery 01/07/2017, 8:00 AM

## 2017-01-07 NOTE — Evaluation (Signed)
Physical Therapy Evaluation Patient Details Name: Carol Doyle MRN: [id number] DOB: 1941-01-23 Today's Date: 01/07/2017   History of Present Illness  LTKA  Clinical Impression  The patient's BP has been low.  , higher manually by Student RN. 104/54 pre in bed  And 108/54 post after ambulation. Patient describes feeling dizzy but was able to ambulate,. The patient will be going to her Mobile coach  At discharge with virtual therapy and daughter  In and out as needed.Pt admitted with above diagnosis. Pt currently with functional limitations due to the deficits listed below (see PT Problem List).  Pt will benefit from skilled PT to increase their independence and safety with mobility to allow discharge to the venue listed below.       Follow Up Recommendations Other (comment) (Virtual therapy )    Equipment Recommendations  None recommended by PT    Recommendations for Other Services       Precautions / Restrictions Precautions Precautions: Fall;Knee Precaution Comments: BP has been low Required Braces or Orthoses: Knee Immobilizer - Left Knee Immobilizer - Left: Discontinue once straight leg raise with < 10 degree lag Restrictions Weight Bearing Restrictions: No      Mobility  Bed Mobility Overal bed mobility: Needs Assistance Bed Mobility: Supine to Sit     Supine to sit: Min assist     General bed mobility comments: assist with the  left leg  Transfers Overall transfer level: Needs assistance Equipment used: Rolling walker (2 wheeled) (as a SW at times) Transfers: Sit to/from [employer] to Stand: Min assist         General transfer comment: cues for hand and Left  leg position  Ambulation/Gait Ambulation/Gait assistance: Min assist Ambulation Distance (Feet): 40 Feet Assistive device: Rolling walker (2 wheeled);Standard walker Gait Pattern/deviations: Step-to pattern     General Gait Details: cues for sequqnce  Stairs            Wheelchair  Mobility    Modified Rankin (Stroke Patients Only)       Balance                                             Pertinent Vitals/Pain Pain Assessment: 0-10 Pain Score: 5  Pain Location: left knee Pain Descriptors / Indicators: Aching Pain Intervention(s): Monitored during session;Premedicated before session;Ice applied    Home Living Family/patient expects to be discharged to:: Other (Comment) (32' motor coach parked at the [place].) Living Arrangements: Children Available Help at Discharge: Family;Available PRN/intermittently Type of Home: Mobile home Home Access: Stairs to enter Entrance Stairs-Rails: Left (another rail at the top) [employer] of Steps: 6 Home Layout: One level Home Equipment: Walker - standard;Bedside commode;Cane - single point Additional Comments: the patient reports that the SW may not fir through the door  at bed and bathroom.    Prior Function Level of Independence: Independent               Hand Dominance        Extremity/Trunk Assessment        Lower Extremity Assessment Lower Extremity Assessment: LLE deficits/detail LLE Deficits / Details: SLR with a lag, knee flexion 50    Cervical / Trunk Assessment Cervical / Trunk Assessment: Normal  Communication   Communication: No difficulties  Cognition Arousal/Alertness: Awake/alert Behavior During Therapy: WFL for tasks assessed/performed Overall Cognitive Status: Within Functional  Limits for tasks assessed                      General Comments      Exercises Total Joint Exercises Ankle Circles/Pumps: AROM;Both Quad Sets: AROM;Both;10 reps Towel Squeeze: AROM;Both;10 reps Heel Slides: AAROM;Left;10 reps Hip ABduction/ADduction: AAROM;Left;10 reps Straight Leg Raises: AAROM;Left;10 reps   Assessment/Plan    PT Assessment Patient needs continued PT services  PT Problem List Decreased strength;Decreased range of motion;Decreased activity  tolerance;Decreased balance;Decreased mobility;Decreased knowledge of precautions;Decreased safety awareness;Decreased knowledge of use of DME          PT Treatment Interventions DME instruction;Gait training;Stair training;Functional mobility training;Therapeutic activities;Therapeutic exercise;Patient/family education    PT Goals (Current goals can be found in the Care Plan section)  Acute Rehab PT Goals Patient Stated Goal: to go  to my motor coach PT Goal Formulation: With patient Time For Goal Achievement: 01/11/17 Potential to Achieve Goals: Good    Frequency 7X/week   Barriers to discharge        Co-evaluation               End of Session Equipment Utilized During Treatment: Left knee immobilizer Activity Tolerance: Patient tolerated treatment well Patient left: in chair;with call bell/phone within reach Nurse Communication: Mobility status         Time: [id number] PT Time Calculation (min) (ACUTE ONLY): 48 min   Charges:   PT Evaluation $PT Eval Low Complexity: 1 Procedure PT Treatments $Gait Training: 8-22 mins $Therapeutic Exercise: 8-22 mins   PT G Codes:        Claretha Cooper 01/07/2017, 10:31 AM Tresa Endo PT [phone]

## 2017-01-07 NOTE — Care Management Note (Addendum)
Case Management Note  Patient Details  Name: Carol Doyle MRN: [id number] Date of Birth: Jun 28, 1941  Subjective/Objective:                  LEFT TOTAL KNEE ARTHROPLASTY (Left) Action/Plan: Discharge planning Expected Discharge Date:  01/08/17               Expected Discharge Plan:  Home/Self Care  In-House Referral:     Discharge planning Services  CM Consult  Post Acute Care Choice:  NA Choice offered to:  Patient  DME Arranged:  N/A DME Agency:  NA  HH Arranged:  NA HH Agency:  NA  Status of Service:  Completed, signed off  If discussed at [place] of Stay Meetings, dates discussed:    Additional Comments: CM met with pt to confirm plan is for virtual PT; pt confirms.  CM notified [facility] DME rep, to please deliver the 3n1 and rolling walker to room prior to discharge. No other CM needs were communicated. Dellie Catholic, RN 01/07/2017, 11:05 AM

## 2017-01-08 ENCOUNTER — Encounter (HOSPITAL_COMMUNITY): Payer: Self-pay | Admitting: Orthopedic Surgery

## 2017-01-08 LAB — BASIC METABOLIC PANEL
ANION GAP: 6 (ref 5–15)
BUN: 16 mg/dL (ref 6–20)
CO2: 28 mmol/L (ref 22–32)
Calcium: 8.5 mg/dL — ABNORMAL LOW (ref 8.9–10.3)
Chloride: 104 mmol/L (ref 101–111)
Creatinine, Ser: 0.89 mg/dL (ref 0.44–1.00)
Glucose, Bld: 117 mg/dL — ABNORMAL HIGH (ref 65–99)
POTASSIUM: 4.1 mmol/L (ref 3.5–5.1)
SODIUM: 138 mmol/L (ref 135–145)

## 2017-01-08 LAB — CBC
HCT: 33.7 % — ABNORMAL LOW (ref 36.0–46.0)
Hemoglobin: 11.5 g/dL — ABNORMAL LOW (ref 12.0–15.0)
MCH: 29.9 pg (ref 26.0–34.0)
MCHC: 34.1 g/dL (ref 30.0–36.0)
MCV: 87.5 fL (ref 78.0–100.0)
PLATELETS: 236 10*3/uL (ref 150–400)
RBC: 3.85 MIL/uL — AB (ref 3.87–5.11)
RDW: 14.1 % (ref 11.5–15.5)
WBC: 12.2 10*3/uL — ABNORMAL HIGH (ref 4.0–10.5)

## 2017-01-08 MED ORDER — METHOCARBAMOL 500 MG PO TABS: 500.0000 mg | ORAL_TABLET | Freq: Four times a day (QID) | ORAL | 0 refills | Status: DC | PRN

## 2017-01-08 MED ORDER — TRAMADOL HCL 50 MG PO TABS: 50.0000 mg | ORAL_TABLET | Freq: Four times a day (QID) | ORAL | 0 refills | Status: DC | PRN

## 2017-01-08 MED ORDER — HYDROCODONE-ACETAMINOPHEN 5-325 MG PO TABS: 1.0000 | ORAL_TABLET | ORAL | 0 refills | Status: DC | PRN

## 2017-01-08 MED ORDER — HYDROCODONE-ACETAMINOPHEN 5-325 MG PO TABS
1.0000 | ORAL_TABLET | ORAL | Status: DC | PRN
  Administered 2017-01-08: 12:00:00 2 via ORAL
  Filled 2017-01-08: qty 2

## 2017-01-08 MED ORDER — RIVAROXABAN 10 MG PO TABS: 10.0000 mg | ORAL_TABLET | Freq: Every day | ORAL | 0 refills | Status: DC

## 2017-01-08 NOTE — Progress Notes (Signed)
   Subjective: 2 Days Post-Op Procedure(s) (LRB): LEFT TOTAL KNEE ARTHROPLASTY (Left) Patient reports pain as mild.   Patient seen in rounds with Dr. Wynelle Link. Patient is well, but has had some minor complaints of pain in the knee, requiring pain medications Patient is ready to go home  Objective: Vital signs in last 24 hours: Temp:  [97.8 F (36.6 C)-98.8 F (37.1 C)] 98.4 F (36.9 C) (01/31 0500) Pulse Rate:  [63-80] 63 (01/31 0500) Resp:  [16-18] 16 (01/31 0500) BP: (108-146)/(54-71) 146/71 (01/31 0500) SpO2:  [93 %-98 %] 94 % (01/31 0500)  Intake/Output from previous day:  Intake/Output Summary (Last 24 hours) at 01/08/17 0916 Last data filed at 01/08/17 0600  Gross per 24 hour  Intake             1400 ml  Output             2675 ml  Net            -1275 ml    Intake/Output this shift: No intake/output data recorded.  Labs:  Recent Labs  01/07/17 0426 01/08/17 0427  HGB 12.3 11.5*    Recent Labs  01/07/17 0426 01/08/17 0427  WBC 15.0* 12.2*  RBC 4.12 3.85*  HCT 36.3 33.7*  PLT 255 236    Recent Labs  01/07/17 0426 01/08/17 0427  NA 135 138  K 4.9 4.1  CL 103 104  CO2 27 28  BUN 13 16  CREATININE 0.95 0.89  GLUCOSE 139* 117*  CALCIUM 8.0* 8.5*   No results for input(s): LABPT, INR in the last 72 hours.  EXAM: General - Patient is Alert, Appropriate and Oriented Extremity - Neurovascular intact Sensation intact distally Intact pulses distally Dorsiflexion/Plantar flexion intact Incision - clean, dry Motor Function - intact, moving foot and toes well on exam.   Assessment/Plan: 2 Days Post-Op Procedure(s) (LRB): LEFT TOTAL KNEE ARTHROPLASTY (Left) Procedure(s) (LRB): LEFT TOTAL KNEE ARTHROPLASTY (Left) Past Medical History:  Diagnosis Date  . Anemia    blood transfusion "many years ago before 1970"  . Anxiety   . Arthritis   . Cancer [facility])    kidney cancer  . History of hiatal hernia 2009   incisional hernia  . Pneumonia    years ago, possibly age 64  . Pre-diabetes    Principal Problem:   OA (osteoarthritis) of knee  Estimated body mass index is 39.78 kg/m as calculated from the following:   Height as of this encounter: 5\' 7"  (1.702 m).   Weight as of this encounter: 115.2 kg (254 lb). Up with therapy Discharge home Diet - Diabetic diet Follow up - in 2 weeks Activity - WBAT Disposition - Home Condition Upon Discharge - Good D/C Meds - See DC Summary DVT Prophylaxis - [place], PA-C Orthopaedic Surgery 01/08/2017, 9:16 AM

## 2017-01-08 NOTE — Progress Notes (Signed)
Occupational Therapy Treatment Patient Details Name: Carol Doyle MRN: [id number] DOB: 06/16/1941 Today's Date: 01/08/2017    History of present illness LTKA   OT comments  Pt states her daughter has the flu.  She will have very little help at discharge.  Encouraged her to ask friends if they can assist her.  Used leg lifter but pain limited session.  Feel pt needs 24/7 or as close to this as she can get for safety, attention and problem solving.  See cognition below  Follow Up Recommendations  Supervision/Assistance - 24 hour    Equipment Recommendations  3 in 1 bedside commode    Recommendations for Other Services      Precautions / Restrictions Precautions Precautions: Fall;Knee Required Braces or Orthoses: Knee Immobilizer - Left Knee Immobilizer - Left: Discontinue once straight leg raise with < 10 degree lag Restrictions Weight Bearing Restrictions: No       Mobility Bed Mobility         Supine to sit: Supervision     General bed mobility comments: leg lifter, cues for sequence  Transfers   Equipment used: Rolling walker (2 wheeled)   Sit to Stand: Min guard         General transfer comment: for safety:  pt reached across walker with LUE resulting in tipping walker.    Balance                                   ADL                       Lower Body Dressing: Minimal assistance;Sit to/from stand (underwear with leg lifter and reacher)                 General ADL Comments: worked with leg lifter to don brace (occasional min A) and used it for bed mobility with supervision.  Pt stood briefly to pull underwear up but was in too much pain to continue.        Vision                     Perception     Praxis      Cognition   Behavior During Therapy: [facility] for tasks assessed/performed                    General Comments: pt needed cues for safety, ? pain medication affecting attention to task.  Also  pt said daughter could assist her as she has had the flu shot.  Recommended daughter not assist her as flu shot is not 100% effective.  Cues given to problem solve sitting EOB with limited space    Extremity/Trunk Assessment               Exercises     Shoulder Instructions       General Comments      Pertinent Vitals/ Pain       Pain Score: 8  Pain Location: L knee Pain Descriptors / Indicators: Aching Pain Intervention(s): Limited activity within patient's tolerance;Monitored during session;Repositioned;Ice applied  Home Living                                          Prior Functioning/Environment  Frequency  Min 2X/week        Progress Toward Goals  OT Goals(current goals can now be found in the care plan section)  Progress towards OT goals: Progressing toward goals  Acute Rehab OT Goals Time For Goal Achievement: 01/14/17  Plan      Co-evaluation                 End of Session     Activity Tolerance No increased pain   Patient Left in bed;with call bell/phone within reach   Nurse Communication          Time: [id number] OT Time Calculation (min): 27 min  Charges: OT General Charges $OT Visit: 1 Procedure OT Treatments $Self Care/Home Management : 23-37 mins  Angelli Baruch 01/08/2017, 11:08 AM  Lesle Chris, OTR/L [phone] 01/08/2017

## 2017-01-08 NOTE — Progress Notes (Signed)
Occupational Therapy Treatment Patient Details Name: Kella Gloeckner MRN: [id number] DOB: [birth year] Today's Date: 01/08/2017    History of present illness LTKA   OT comments  Pts pain better controlled and pt able to tolerate much more this pm.  Continues to need cues for safety. Daughter present for this session  Follow Up Recommendations  Supervision/Assistance - 24 hour    Equipment Recommendations  3 in 1 bedside commode    Recommendations for Other Services      Precautions / Restrictions Precautions Precautions: Fall;Knee Required Braces or Orthoses: Knee Immobilizer - Left Knee Immobilizer - Left: Discontinue once straight leg raise with < 10 degree lag Restrictions Weight Bearing Restrictions: No       Mobility Bed Mobility         Supine to sit: Supervision     General bed mobility comments: leg lifter, cues for sequence  Transfers   Equipment used: Rolling walker (2 wheeled)   Sit to Stand: Min guard         General transfer comment: for safety.  cues for UE placement    Balance                                   ADL                           Toilet Transfer: Min guard;Ambulation;RW   Toileting- Clothing Manipulation and Hygiene: Minimal assistance;Sit to/from stand   Tub/ Shower Transfer: Walk-in shower;Min guard;Ambulation;3 in 1     General ADL Comments: daughter present and can be with pt most of the time.  Reinforced safety and practiced above transfers.  Pt did have a LOB when pulling underwear up.  Pt managed brace and bed mobility with supervision using leg lifter; she has bought one      Vision                     Perception     Praxis      Cognition   Behavior During Therapy: [facility] for tasks assessed/performed                    General Comments: pt continues to be distractible; cues for safety, especially when releasing one hand at a time to manage clothing.  Decreased STM:  Pt  told me she didn't know that I was leaving her this am despite me telling her that I would be back later.  Told RN that she was ready for me immediately after receiving meds,  and I had told her I would wait at least 1/2 hour approximately 3 minutes prior to this.   She also told NT that I had only covered her up and did not work with her this am.  She had no recollection of working with me.    Extremity/Trunk Assessment               Exercises     Shoulder Instructions       General Comments      Pertinent Vitals/ Pain       Pain Score: 5  Pain Location: L knee Pain Descriptors / Indicators: Aching Pain Intervention(s): Limited activity within patient's tolerance;Monitored during session;Premedicated before session;Repositioned;Ice applied  Home Living  Prior Functioning/Environment              Frequency  Min 2X/week        Progress Toward Goals  OT Goals(current goals can now be found in the care plan section)  Progress towards OT goals: Progressing toward goals     Plan      Co-evaluation                 End of Session     Activity Tolerance No increased pain   Patient Left in bed;with call bell/phone within reach;with bed alarm set;with family/visitor present   Nurse Communication          Time: [id number] OT Time Calculation (min): 33 min  Charges: OT General Charges $OT Visit: 1 Procedure OT Treatments $Self Care/Home Management : 23-37 mins  Fatoumata Albaugh 01/08/2017, 2:12 PM  Lesle Chris, OTR/L [phone] 01/08/2017

## 2017-01-08 NOTE — Progress Notes (Signed)
Physical Therapy Treatment Patient Details Name: Rynesha Lamarque MRN: [id number] DOB: 15-Nov-1941 Today's Date: 01/08/2017    History of Present Illness LTKA    PT Comments    The patient's daughter Jackelyn Hoehn for instructions. The patient is progressing well, did well on steps. ready for DC.  Follow Up Recommendations  Other (comment)     Equipment Recommendations  None recommended by PT    Recommendations for Other Services       Precautions / Restrictions Precautions Precautions: Fall;Knee Required Braces or Orthoses: Knee Immobilizer - Left Knee Immobilizer - Left: Discontinue once straight leg raise with < 10 degree lag Restrictions Weight Bearing Restrictions: No    Mobility  Bed Mobility         Supine to sit: Supervision     General bed mobility comments: leg lifter, cues for sequence  Transfers Overall transfer level: Needs assistance Equipment used: Rolling walker (2 wheeled) Transfers: Sit to/from Stand Sit to Stand: Min guard         General transfer comment: for safety.  cues for UE placement  Ambulation/Gait Ambulation/Gait assistance: Min guard Ambulation Distance (Feet): 40 Feet Assistive device: Rolling walker (2 wheeled) Gait Pattern/deviations: Step-through pattern;Step-to pattern     General Gait Details: cues for sequence. ambulated x 10' without KI and di well   Stairs     Stair Management: One rail Left;Step to pattern;Backwards;Forwards Number of Stairs: 3 General stair comments: daughter present. patient negotiated up with left rail and simukllated rail on right. came down backwaards.   Wheelchair Mobility    Modified Rankin (Stroke Patients Only)       Balance                                    Cognition Arousal/Alertness: Awake/alert Behavior During Therapy: Impulsive                   General Comments: pt continues to be distractible; cues for safety, especially when releasing one hand  at a time to manage clothing.  Decreased STM from this am    Exercises Total Joint Exercises Ankle Circles/Pumps: AROM;Both Quad Sets: AROM;Both;10 reps Heel Slides: AAROM;Left;10 reps Hip ABduction/ADduction: AAROM;Left;10 reps Straight Leg Raises: AAROM;Left;10 reps Goniometric ROM: 10-60    General Comments        Pertinent Vitals/Pain Pain Score: 4  Pain Location: L knee Pain Descriptors / Indicators: Aching Pain Intervention(s): Monitored during session;Premedicated before session    Home Living                      Prior Function            PT Goals (current goals can now be found in the care plan section) Progress towards PT goals: Progressing toward goals    Frequency    7X/week      PT Plan Current plan remains appropriate    Co-evaluation             End of Session Equipment Utilized During Treatment: Left knee immobilizer Activity Tolerance: Patient tolerated treatment well Patient left: in chair;with call bell/phone within reach;with family/visitor present     Time: [id number] PT Time Calculation (min) (ACUTE ONLY): 54 min  Charges:  $Gait Training: 23-37 mins $Therapeutic Exercise: 8-22 mins $Self Care/Home Management: 8-22  G Codes:      Claretha Cooper 01/08/2017, 4:49 PM

## 2017-01-14 DIAGNOSIS — M25662 Stiffness of left knee, not elsewhere classified: Secondary | ICD-10-CM | POA: Diagnosis not present

## 2017-01-15 NOTE — Discharge Summary (Signed)
Physician Discharge Summary   Patient ID: Carol Doyle MRN: [id number] DOB/AGE: 1941/01/03 76 y.o.  Admit date: 01/06/2017 Discharge date: 01/08/2017  Primary Diagnosis:  Osteoarthritis  Left knee(s) Admission Diagnoses:  Past Medical History:  Diagnosis Date  . Anemia    blood transfusion "many years ago before 1970"  . Anxiety   . Arthritis   . Cancer [facility])    kidney cancer  . History of hiatal hernia 2009   incisional hernia  . Pneumonia    years ago, possibly age 82  . Pre-diabetes    Discharge Diagnoses:   Principal Problem:   OA (osteoarthritis) of knee  Estimated body mass index is 39.78 kg/m as calculated from the following:   Height as of this encounter: 5' 7"  (1.702 m).   Weight as of this encounter: 115.2 kg (254 lb).  Procedure:  Procedure(s) (LRB): LEFT TOTAL KNEE ARTHROPLASTY (Left)   Consults: None  HPI: Carol Doyle is a 76 y.o. year old female with end stage OA of her left knee with progressively worsening pain and dysfunction. She has constant pain, with activity and at rest and significant functional deficits with difficulties even with ADLs. She has had extensive non-op management including analgesics, injections of cortisone and viscosupplements, and home exercise program, but remains in significant pain with significant dysfunction. Radiographs show bone on bone arthritis medial and patellofemoral. She presents now for left Total Knee Arthroplasty.   Laboratory Data: Admission on 01/06/2017, Discharged on 01/08/2017  Component Date Value Ref Range Status  . Glucose-Capillary 01/06/2017 98  65 - 99 mg/dL Final  . Comment 1 01/06/2017 Notify RN   Final  . WBC 01/07/2017 15.0* 4.0 - 10.5 K/uL Final  . RBC 01/07/2017 4.12  3.87 - 5.11 MIL/uL Final  . Hemoglobin 01/07/2017 12.3  12.0 - 15.0 g/dL Final  . HCT 01/07/2017 36.3  36.0 - 46.0 % Final  . MCV 01/07/2017 88.1  78.0 - 100.0 fL Final  . MCH 01/07/2017 29.9  26.0 - 34.0 pg Final  . MCHC  01/07/2017 33.9  30.0 - 36.0 g/dL Final  . RDW 01/07/2017 13.8  11.5 - 15.5 % Final  . Platelets 01/07/2017 255  150 - 400 K/uL Final  . Sodium 01/07/2017 135  135 - 145 mmol/L Final  . Potassium 01/07/2017 4.9  3.5 - 5.1 mmol/L Final  . Chloride 01/07/2017 103  101 - 111 mmol/L Final  . CO2 01/07/2017 27  22 - 32 mmol/L Final  . Glucose, Bld 01/07/2017 139* 65 - 99 mg/dL Final  . BUN 01/07/2017 13  6 - 20 mg/dL Final  . Creatinine, Ser 01/07/2017 0.95  0.44 - 1.00 mg/dL Final  . Calcium 01/07/2017 8.0* 8.9 - 10.3 mg/dL Final  . GFR calc non Af Amer 01/07/2017 58* >60 mL/min Final  . GFR calc Af Amer 01/07/2017 >60  >60 mL/min Final   Comment: (NOTE) The eGFR has been calculated using the CKD EPI equation. This calculation has not been validated in all clinical situations. eGFR's persistently <60 mL/min signify possible Chronic Kidney Disease.   . Anion gap 01/07/2017 5  5 - 15 Final  . Glucose-Capillary 01/07/2017 161* 65 - 99 mg/dL Final  . WBC 01/08/2017 12.2* 4.0 - 10.5 K/uL Final  . RBC 01/08/2017 3.85* 3.87 - 5.11 MIL/uL Final  . Hemoglobin 01/08/2017 11.5* 12.0 - 15.0 g/dL Final  . HCT 01/08/2017 33.7* 36.0 - 46.0 % Final  . MCV 01/08/2017 87.5  78.0 - 100.0 fL Final  .  MCH 01/08/2017 29.9  26.0 - 34.0 pg Final  . MCHC 01/08/2017 34.1  30.0 - 36.0 g/dL Final  . RDW 01/08/2017 14.1  11.5 - 15.5 % Final  . Platelets 01/08/2017 236  150 - 400 K/uL Final  . Sodium 01/08/2017 138  135 - 145 mmol/L Final  . Potassium 01/08/2017 4.1  3.5 - 5.1 mmol/L Final  . Chloride 01/08/2017 104  101 - 111 mmol/L Final  . CO2 01/08/2017 28  22 - 32 mmol/L Final  . Glucose, Bld 01/08/2017 117* 65 - 99 mg/dL Final  . BUN 01/08/2017 16  6 - 20 mg/dL Final  . Creatinine, Ser 01/08/2017 0.89  0.44 - 1.00 mg/dL Final  . Calcium 01/08/2017 8.5* 8.9 - 10.3 mg/dL Final  . GFR calc non Af Amer 01/08/2017 >60  >60 mL/min Final  . GFR calc Af Amer 01/08/2017 >60  >60 mL/min Final   Comment:  (NOTE) The eGFR has been calculated using the CKD EPI equation. This calculation has not been validated in all clinical situations. eGFR's persistently <60 mL/min signify possible Chronic Kidney Disease.   Georgiann Hahn gap 01/08/2017 6  5 - 15 Final  Hospital Outpatient Visit on 12/30/2016  Component Date Value Ref Range Status  . aPTT 12/30/2016 30  24 - 36 seconds Final  . WBC 12/30/2016 9.9  4.0 - 10.5 K/uL Final  . RBC 12/30/2016 4.86  3.87 - 5.11 MIL/uL Final  . Hemoglobin 12/30/2016 14.5  12.0 - 15.0 g/dL Final  . HCT 12/30/2016 42.9  36.0 - 46.0 % Final  . MCV 12/30/2016 88.3  78.0 - 100.0 fL Final  . MCH 12/30/2016 29.8  26.0 - 34.0 pg Final  . MCHC 12/30/2016 33.8  30.0 - 36.0 g/dL Final  . RDW 12/30/2016 14.1  11.5 - 15.5 % Final  . Platelets 12/30/2016 248  150 - 400 K/uL Final  . Sodium 12/30/2016 138  135 - 145 mmol/L Final  . Potassium 12/30/2016 4.1  3.5 - 5.1 mmol/L Final  . Chloride 12/30/2016 104  101 - 111 mmol/L Final  . CO2 12/30/2016 27  22 - 32 mmol/L Final  . Glucose, Bld 12/30/2016 91  65 - 99 mg/dL Final  . BUN 12/30/2016 17  6 - 20 mg/dL Final  . Creatinine, Ser 12/30/2016 0.90  0.44 - 1.00 mg/dL Final  . Calcium 12/30/2016 8.4* 8.9 - 10.3 mg/dL Final  . Total Protein 12/30/2016 6.8  6.5 - 8.1 g/dL Final  . Albumin 12/30/2016 3.4* 3.5 - 5.0 g/dL Final  . AST 12/30/2016 19  15 - 41 U/L Final  . ALT 12/30/2016 16  14 - 54 U/L Final  . Alkaline Phosphatase 12/30/2016 57  38 - 126 U/L Final  . Total Bilirubin 12/30/2016 0.8  0.3 - 1.2 mg/dL Final  . GFR calc non Af Amer 12/30/2016 >60  >60 mL/min Final  . GFR calc Af Amer 12/30/2016 >60  >60 mL/min Final   Comment: (NOTE) The eGFR has been calculated using the CKD EPI equation. This calculation has not been validated in all clinical situations. eGFR's persistently <60 mL/min signify possible Chronic Kidney Disease.   . Anion gap 12/30/2016 7  5 - 15 Final  . Prothrombin Time 12/30/2016 12.8  11.4 - 15.2  seconds Final  . INR 12/30/2016 0.97   Final  . ABO/RH(D) 12/30/2016 O POS   Final  . Antibody Screen 12/30/2016 NEG   Final  . Sample Expiration 12/30/2016 01/13/2017   Final  .  Extend sample reason 12/30/2016 NO TRANSFUSIONS OR PREGNANCY IN THE PAST 3 MONTHS   Final  . MRSA, PCR 12/30/2016 NEGATIVE  NEGATIVE Final  . Staphylococcus aureus 12/30/2016 NEGATIVE  NEGATIVE Final   Comment:        The Xpert SA Assay (FDA approved for NASAL specimens in patients over 67 years of age), is one component of a comprehensive surveillance program.  Test performance has been validated by [facility] for patients greater than or equal to 78 year old. It is not intended to diagnose infection nor to guide or monitor treatment.   . Hgb A1c MFr Bld 12/30/2016 5.6  4.8 - 5.6 % Final   Comment: (NOTE)         Pre-diabetes: 5.7 - 6.4         Diabetes: >6.4         Glycemic control for adults with diabetes: <7.0   . Mean Plasma Glucose 12/30/2016 114  mg/dL Final   Comment: (NOTE) Performed At: [facility] [place], [place] [id number] Lindon Romp MD UM:[id number]   . ABO/RH(D) 12/30/2016 O POS   Final     X-Rays:No results found.  EKG:No orders found for this or any previous visit.   Hospital Course: Carol Doyle is a 76 y.o. who was admitted to [facility]. They were brought to the operating room on 01/06/2017 and underwent Procedure(s): LEFT TOTAL KNEE ARTHROPLASTY.  Patient tolerated the procedure well and was later transferred to the recovery room and then to the orthopaedic floor for postoperative care.  They were given PO and IV analgesics for pain control following their surgery.  They were given 24 hours of postoperative antibiotics of  Anti-infectives    Start     Dose/Rate Route Frequency Ordered Stop   01/06/17 1900  ceFAZolin (ANCEF) IVPB 2g/100 mL premix     2 g 200 mL/hr over 30 Minutes Intravenous Every 6 hours 01/06/17 1705 01/07/17  0049   01/06/17 1004  ceFAZolin (ANCEF) IVPB 2g/100 mL premix     2 g 200 mL/hr over 30 Minutes Intravenous On call to O.R. 01/06/17 1004 01/06/17 1259     and started on DVT prophylaxis in the form of Xarelto.   PT and OT were ordered for total joint protocol.  Discharge planning consulted to help with postop disposition and equipment needs.  Patient had a tough night on the evening of surgery.  They started to get up OOB with therapy on day one. Hemovac drain was pulled without difficulty.  Continued to work with therapy into day two.  Dressing was changed on day two and the incision was healing well.  Patient was seen in rounds on day two, improving, and was ready to go home. Discharge home - Home and plans to do in home Virtual Therapy utilizing the [place]. Diet - Diabetic diet Follow up - in 2 weeks Activity - WBAT Disposition - Home Condition Upon Discharge - Good D/C Meds - See DC Summary DVT Prophylaxis - Xarelto   Discharge Instructions    Call MD / Call 911    Complete by:  As directed    If you experience chest pain or shortness of breath, CALL 911 and be transported to the hospital emergency room.  If you develope a fever above 101 F, pus (white drainage) or increased drainage or redness at the wound, or calf pain, call your surgeon's office.   Change dressing    Complete by:  As directed    Change dressing daily with sterile 4 x 4 inch gauze dressing and apply TED hose. Do not submerge the incision under water.   Constipation Prevention    Complete by:  As directed    Drink plenty of fluids.  Prune juice may be helpful.  You may use a stool softener, such as Colace (over the counter) 100 mg twice a day.  Use MiraLax (over the counter) for constipation as needed.   Diet Carb Modified    Complete by:  As directed    Discharge instructions    Complete by:  As directed    Pick up stool softner and laxative for home use following surgery while on pain medications. Do not  submerge incision under water. Please use good hand washing techniques while changing dressing each day. May shower starting three days after surgery. Please use a clean towel to pat the incision dry following showers. Continue to use ice for pain and swelling after surgery. Do not use any lotions or creams on the incision until instructed by your surgeon.  Wear both TED hose on both legs during the day every day for three weeks, but may have off at night at home.  Postoperative Constipation Protocol  Constipation - defined medically as fewer than three stools per week and severe constipation as less than one stool per week.  One of the most common issues patients have following surgery is constipation.  Even if you have a regular bowel pattern at home, your normal regimen is likely to be disrupted due to multiple reasons following surgery.  Combination of anesthesia, postoperative narcotics, change in appetite and fluid intake all can affect your bowels.  In order to avoid complications following surgery, here are some recommendations in order to help you during your recovery period.  Colace (docusate) - Pick up an over-the-counter form of Colace or another stool softener and take twice a day as long as you are requiring postoperative pain medications.  Take with a full glass of water daily.  If you experience loose stools or diarrhea, hold the colace until you stool forms back up.  If your symptoms do not get better within 1 week or if they get worse, check with your doctor.  Dulcolax (bisacodyl) - Pick up over-the-counter and take as directed by the product packaging as needed to assist with the movement of your bowels.  Take with a full glass of water.  Use this product as needed if not relieved by Colace only.   MiraLax (polyethylene glycol) - Pick up over-the-counter to have on hand.  MiraLax is a solution that will increase the amount of water in your bowels to assist with bowel movements.   Take as directed and can mix with a glass of water, juice, soda, coffee, or tea.  Take if you go more than two days without a movement. Do not use MiraLax more than once per day. Call your doctor if you are still constipated or irregular after using this medication for 7 days in a row.  If you continue to have problems with postoperative constipation, please contact the office for further assistance and recommendations.  If you experience "the worst abdominal pain ever" or develop nausea or vomiting, please contact the office immediatly for further recommendations for treatment.   Take Xarelto for two and a half more weeks, then discontinue Xarelto. Once the patient has completed the blood thinner regimen, then take a Baby 81 mg Aspirin daily for three  more weeks.   Do not put a pillow under the knee. Place it under the heel.    Complete by:  As directed    Do not sit on low chairs, stoools or toilet seats, as it may be difficult to get up from low surfaces    Complete by:  As directed    Driving restrictions    Complete by:  As directed    No driving until released by the physician.   Increase activity slowly as tolerated    Complete by:  As directed    Lifting restrictions    Complete by:  As directed    No lifting until released by the physician.   Patient may shower    Complete by:  As directed    You may shower without a dressing once there is no drainage.  Do not wash over the wound.  If drainage remains, do not shower until drainage stops.   TED hose    Complete by:  As directed    Use stockings (TED hose) for 3 weeks on both leg(s).  You may remove them at night for sleeping.   Weight bearing as tolerated    Complete by:  As directed    Laterality:  left   Extremity:  Lower     Allergies as of 01/08/2017   No Known Allergies     Medication List    STOP taking these medications   cholecalciferol 1000 units tablet Commonly known as:  VITAMIN D   estrogens (conjugated)  0.625 MG tablet Commonly known as:  PREMARIN     TAKE these medications   atorvastatin 10 MG tablet Commonly known as:  LIPITOR Take 10 mg by mouth daily at 6 PM.   diphenhydrAMINE 25 MG tablet Commonly known as:  BENADRYL Take 50 mg by mouth at bedtime as needed for sleep.   FLUoxetine 20 MG capsule Commonly known as:  PROZAC take 2 capsule daily   HYDROcodone-acetaminophen 5-325 MG tablet Commonly known as:  NORCO/VICODIN Take 1-2 tablets by mouth every 4 (four) hours as needed for moderate pain.   methocarbamol 500 MG tablet Commonly known as:  ROBAXIN Take 1 tablet (500 mg total) by mouth every 6 (six) hours as needed for muscle spasms.   MYRBETRIQ 50 MG Tb24 tablet Generic drug:  mirabegron ER Take 50 mg by mouth daily.   rivaroxaban 10 MG Tabs tablet Commonly known as:  XARELTO Take 1 tablet (10 mg total) by mouth daily with breakfast. Take Xarelto for two and a half more weeks following discharge from the hospital, then discontinue Xarelto. Once the patient has completed the blood thinner regimen, then take a Baby 81 mg Aspirin daily for three more weeks.   traMADol 50 MG tablet Commonly known as:  ULTRAM Take 1-2 tablets (50-100 mg total) by mouth every 6 (six) hours as needed for moderate pain. What changed:  how much to take      Follow-up Information    Gearlean Alf, MD. Schedule an appointment as soon as possible for a visit on 01/21/2017.   Specialty:  Orthopedic Surgery Contact information: [address] [place] [address] [phone]        Inc[facility] - [facility] Follow up.   Why:  rolling walker and 3n1(over the commode seat) Contact information: [place] [address] [phone]           Signed: Arlee Muslim, PA-C Orthopaedic Surgery 01/15/2017, 9:01 PM

## 2017-01-20 DIAGNOSIS — T1490XA Injury, unspecified, initial encounter: Secondary | ICD-10-CM | POA: Diagnosis not present

## 2017-01-21 DIAGNOSIS — Z96652 Presence of left artificial knee joint: Secondary | ICD-10-CM | POA: Diagnosis not present

## 2017-01-21 DIAGNOSIS — Z471 Aftercare following joint replacement surgery: Secondary | ICD-10-CM | POA: Diagnosis not present

## 2017-02-11 DIAGNOSIS — Z471 Aftercare following joint replacement surgery: Secondary | ICD-10-CM | POA: Diagnosis not present

## 2017-02-11 DIAGNOSIS — Z96652 Presence of left artificial knee joint: Secondary | ICD-10-CM | POA: Diagnosis not present

## 2017-03-18 DIAGNOSIS — Z471 Aftercare following joint replacement surgery: Secondary | ICD-10-CM | POA: Diagnosis not present

## 2017-03-18 DIAGNOSIS — Z96652 Presence of left artificial knee joint: Secondary | ICD-10-CM | POA: Diagnosis not present

## 2017-04-03 DIAGNOSIS — R3914 Feeling of incomplete bladder emptying: Secondary | ICD-10-CM | POA: Diagnosis not present

## 2017-04-03 DIAGNOSIS — Z905 Acquired absence of kidney: Secondary | ICD-10-CM | POA: Diagnosis not present

## 2017-04-03 DIAGNOSIS — N952 Postmenopausal atrophic vaginitis: Secondary | ICD-10-CM | POA: Diagnosis not present

## 2017-04-03 DIAGNOSIS — Z85528 Personal history of other malignant neoplasm of kidney: Secondary | ICD-10-CM | POA: Diagnosis not present

## 2017-04-03 DIAGNOSIS — N3946 Mixed incontinence: Secondary | ICD-10-CM | POA: Diagnosis not present

## 2017-04-03 DIAGNOSIS — Z87440 Personal history of urinary (tract) infections: Secondary | ICD-10-CM | POA: Diagnosis not present

## 2017-05-07 DIAGNOSIS — N3946 Mixed incontinence: Secondary | ICD-10-CM | POA: Diagnosis not present

## 2017-05-07 DIAGNOSIS — R1032 Left lower quadrant pain: Secondary | ICD-10-CM | POA: Diagnosis not present

## 2017-06-04 DIAGNOSIS — Z85520 Personal history of malignant carcinoid tumor of kidney: Secondary | ICD-10-CM | POA: Diagnosis not present

## 2017-06-04 DIAGNOSIS — Z85528 Personal history of other malignant neoplasm of kidney: Secondary | ICD-10-CM | POA: Diagnosis not present

## 2017-06-04 DIAGNOSIS — C649 Malignant neoplasm of unspecified kidney, except renal pelvis: Secondary | ICD-10-CM | POA: Diagnosis not present

## 2017-06-24 DIAGNOSIS — Z79899 Other long term (current) drug therapy: Secondary | ICD-10-CM | POA: Diagnosis not present

## 2017-06-24 DIAGNOSIS — E7800 Pure hypercholesterolemia, unspecified: Secondary | ICD-10-CM | POA: Diagnosis not present

## 2017-06-24 DIAGNOSIS — Z7189 Other specified counseling: Secondary | ICD-10-CM | POA: Diagnosis not present

## 2017-06-24 DIAGNOSIS — Z0000 Encounter for general adult medical examination without abnormal findings: Secondary | ICD-10-CM | POA: Diagnosis not present

## 2017-06-24 DIAGNOSIS — E1165 Type 2 diabetes mellitus with hyperglycemia: Secondary | ICD-10-CM | POA: Diagnosis not present

## 2017-06-24 DIAGNOSIS — Z1389 Encounter for screening for other disorder: Secondary | ICD-10-CM | POA: Diagnosis not present

## 2017-06-24 DIAGNOSIS — E559 Vitamin D deficiency, unspecified: Secondary | ICD-10-CM | POA: Diagnosis not present

## 2017-06-24 DIAGNOSIS — F419 Anxiety disorder, unspecified: Secondary | ICD-10-CM | POA: Diagnosis not present

## 2017-06-24 DIAGNOSIS — Z299 Encounter for prophylactic measures, unspecified: Secondary | ICD-10-CM | POA: Diagnosis not present

## 2017-06-24 DIAGNOSIS — Z1211 Encounter for screening for malignant neoplasm of colon: Secondary | ICD-10-CM | POA: Diagnosis not present

## 2017-06-24 DIAGNOSIS — Z6838 Body mass index (BMI) 38.0-38.9, adult: Secondary | ICD-10-CM | POA: Diagnosis not present

## 2017-06-24 DIAGNOSIS — R5383 Other fatigue: Secondary | ICD-10-CM | POA: Diagnosis not present

## 2017-08-04 DIAGNOSIS — D1039 Benign neoplasm of other parts of mouth: Secondary | ICD-10-CM | POA: Diagnosis not present

## 2017-09-24 DIAGNOSIS — Z299 Encounter for prophylactic measures, unspecified: Secondary | ICD-10-CM | POA: Diagnosis not present

## 2017-09-24 DIAGNOSIS — Z23 Encounter for immunization: Secondary | ICD-10-CM | POA: Diagnosis not present

## 2017-09-24 DIAGNOSIS — E1165 Type 2 diabetes mellitus with hyperglycemia: Secondary | ICD-10-CM | POA: Diagnosis not present

## 2017-09-24 DIAGNOSIS — R232 Flushing: Secondary | ICD-10-CM | POA: Diagnosis not present

## 2017-09-24 DIAGNOSIS — E7800 Pure hypercholesterolemia, unspecified: Secondary | ICD-10-CM | POA: Diagnosis not present

## 2017-09-24 DIAGNOSIS — Z6838 Body mass index (BMI) 38.0-38.9, adult: Secondary | ICD-10-CM | POA: Diagnosis not present

## 2017-11-04 DIAGNOSIS — E119 Type 2 diabetes mellitus without complications: Secondary | ICD-10-CM | POA: Diagnosis not present

## 2017-11-04 DIAGNOSIS — H40033 Anatomical narrow angle, bilateral: Secondary | ICD-10-CM | POA: Diagnosis not present

## 2017-11-19 DIAGNOSIS — H25812 Combined forms of age-related cataract, left eye: Secondary | ICD-10-CM | POA: Diagnosis not present

## 2017-11-19 DIAGNOSIS — H2512 Age-related nuclear cataract, left eye: Secondary | ICD-10-CM | POA: Diagnosis not present

## 2017-11-28 DIAGNOSIS — B079 Viral wart, unspecified: Secondary | ICD-10-CM | POA: Diagnosis not present

## 2017-11-28 DIAGNOSIS — H04123 Dry eye syndrome of bilateral lacrimal glands: Secondary | ICD-10-CM | POA: Diagnosis not present

## 2017-11-28 DIAGNOSIS — Z299 Encounter for prophylactic measures, unspecified: Secondary | ICD-10-CM | POA: Diagnosis not present

## 2017-11-28 DIAGNOSIS — Z6839 Body mass index (BMI) 39.0-39.9, adult: Secondary | ICD-10-CM | POA: Diagnosis not present

## 2017-12-06 DIAGNOSIS — H04123 Dry eye syndrome of bilateral lacrimal glands: Secondary | ICD-10-CM | POA: Diagnosis not present

## 2017-12-30 DIAGNOSIS — Z87891 Personal history of nicotine dependence: Secondary | ICD-10-CM | POA: Diagnosis not present

## 2017-12-30 DIAGNOSIS — Z6841 Body Mass Index (BMI) 40.0 and over, adult: Secondary | ICD-10-CM | POA: Diagnosis not present

## 2017-12-30 DIAGNOSIS — E1165 Type 2 diabetes mellitus with hyperglycemia: Secondary | ICD-10-CM | POA: Diagnosis not present

## 2017-12-30 DIAGNOSIS — Z713 Dietary counseling and surveillance: Secondary | ICD-10-CM | POA: Diagnosis not present

## 2017-12-30 DIAGNOSIS — Z1231 Encounter for screening mammogram for malignant neoplasm of breast: Secondary | ICD-10-CM | POA: Diagnosis not present

## 2017-12-30 DIAGNOSIS — Z299 Encounter for prophylactic measures, unspecified: Secondary | ICD-10-CM | POA: Diagnosis not present

## 2018-01-14 DIAGNOSIS — R928 Other abnormal and inconclusive findings on diagnostic imaging of breast: Secondary | ICD-10-CM | POA: Diagnosis not present

## 2018-01-14 DIAGNOSIS — N6489 Other specified disorders of breast: Secondary | ICD-10-CM | POA: Diagnosis not present

## 2018-05-14 DIAGNOSIS — Z87440 Personal history of urinary (tract) infections: Secondary | ICD-10-CM | POA: Diagnosis not present

## 2018-05-14 DIAGNOSIS — Z85528 Personal history of other malignant neoplasm of kidney: Secondary | ICD-10-CM | POA: Diagnosis not present

## 2018-05-14 DIAGNOSIS — N3946 Mixed incontinence: Secondary | ICD-10-CM | POA: Diagnosis not present

## 2018-05-14 DIAGNOSIS — N952 Postmenopausal atrophic vaginitis: Secondary | ICD-10-CM | POA: Diagnosis not present

## 2018-05-14 DIAGNOSIS — N3281 Overactive bladder: Secondary | ICD-10-CM | POA: Diagnosis not present

## 2018-06-30 DIAGNOSIS — K219 Gastro-esophageal reflux disease without esophagitis: Secondary | ICD-10-CM | POA: Diagnosis not present

## 2018-06-30 DIAGNOSIS — N390 Urinary tract infection, site not specified: Secondary | ICD-10-CM | POA: Diagnosis not present

## 2018-06-30 DIAGNOSIS — J45909 Unspecified asthma, uncomplicated: Secondary | ICD-10-CM | POA: Diagnosis not present

## 2018-06-30 DIAGNOSIS — Z6841 Body Mass Index (BMI) 40.0 and over, adult: Secondary | ICD-10-CM | POA: Diagnosis not present

## 2018-06-30 DIAGNOSIS — M7022 Olecranon bursitis, left elbow: Secondary | ICD-10-CM | POA: Diagnosis not present

## 2018-06-30 DIAGNOSIS — E1165 Type 2 diabetes mellitus with hyperglycemia: Secondary | ICD-10-CM | POA: Diagnosis not present

## 2018-06-30 DIAGNOSIS — R350 Frequency of micturition: Secondary | ICD-10-CM | POA: Diagnosis not present

## 2018-06-30 DIAGNOSIS — Z299 Encounter for prophylactic measures, unspecified: Secondary | ICD-10-CM | POA: Diagnosis not present

## 2018-07-14 DIAGNOSIS — Z85528 Personal history of other malignant neoplasm of kidney: Secondary | ICD-10-CM | POA: Diagnosis not present

## 2018-07-14 DIAGNOSIS — Q600 Renal agenesis, unilateral: Secondary | ICD-10-CM | POA: Diagnosis not present

## 2018-07-14 DIAGNOSIS — R1032 Left lower quadrant pain: Secondary | ICD-10-CM | POA: Diagnosis not present

## 2018-07-14 DIAGNOSIS — Z905 Acquired absence of kidney: Secondary | ICD-10-CM | POA: Diagnosis not present

## 2018-08-18 DIAGNOSIS — Z85528 Personal history of other malignant neoplasm of kidney: Secondary | ICD-10-CM | POA: Diagnosis not present

## 2018-08-18 DIAGNOSIS — C649 Malignant neoplasm of unspecified kidney, except renal pelvis: Secondary | ICD-10-CM | POA: Diagnosis not present

## 2018-08-18 DIAGNOSIS — Q600 Renal agenesis, unilateral: Secondary | ICD-10-CM | POA: Diagnosis not present

## 2018-09-08 DIAGNOSIS — M7661 Achilles tendinitis, right leg: Secondary | ICD-10-CM | POA: Diagnosis not present

## 2018-10-12 DIAGNOSIS — M25571 Pain in right ankle and joints of right foot: Secondary | ICD-10-CM | POA: Diagnosis not present

## 2018-10-12 DIAGNOSIS — M67961 Unspecified disorder of synovium and tendon, right lower leg: Secondary | ICD-10-CM | POA: Diagnosis not present

## 2018-11-23 DIAGNOSIS — Z6839 Body mass index (BMI) 39.0-39.9, adult: Secondary | ICD-10-CM | POA: Diagnosis not present

## 2018-11-23 DIAGNOSIS — Z87891 Personal history of nicotine dependence: Secondary | ICD-10-CM | POA: Diagnosis not present

## 2018-11-23 DIAGNOSIS — Z299 Encounter for prophylactic measures, unspecified: Secondary | ICD-10-CM | POA: Diagnosis not present

## 2018-11-23 DIAGNOSIS — J45901 Unspecified asthma with (acute) exacerbation: Secondary | ICD-10-CM | POA: Diagnosis not present

## 2018-11-23 DIAGNOSIS — N189 Chronic kidney disease, unspecified: Secondary | ICD-10-CM | POA: Diagnosis not present

## 2018-12-07 DIAGNOSIS — E1165 Type 2 diabetes mellitus with hyperglycemia: Secondary | ICD-10-CM | POA: Diagnosis not present

## 2018-12-07 DIAGNOSIS — Z6839 Body mass index (BMI) 39.0-39.9, adult: Secondary | ICD-10-CM | POA: Diagnosis not present

## 2018-12-07 DIAGNOSIS — Z299 Encounter for prophylactic measures, unspecified: Secondary | ICD-10-CM | POA: Diagnosis not present

## 2018-12-07 DIAGNOSIS — J45909 Unspecified asthma, uncomplicated: Secondary | ICD-10-CM | POA: Diagnosis not present

## 2019-02-23 DIAGNOSIS — E7800 Pure hypercholesterolemia, unspecified: Secondary | ICD-10-CM | POA: Diagnosis not present

## 2019-02-23 DIAGNOSIS — M7552 Bursitis of left shoulder: Secondary | ICD-10-CM | POA: Diagnosis not present

## 2019-02-23 DIAGNOSIS — Z299 Encounter for prophylactic measures, unspecified: Secondary | ICD-10-CM | POA: Diagnosis not present

## 2019-02-23 DIAGNOSIS — W57XXXA Bitten or stung by nonvenomous insect and other nonvenomous arthropods, initial encounter: Secondary | ICD-10-CM | POA: Diagnosis not present

## 2019-02-23 DIAGNOSIS — Z6839 Body mass index (BMI) 39.0-39.9, adult: Secondary | ICD-10-CM | POA: Diagnosis not present

## 2019-02-23 DIAGNOSIS — S40261A Insect bite (nonvenomous) of right shoulder, initial encounter: Secondary | ICD-10-CM | POA: Diagnosis not present

## 2019-02-23 DIAGNOSIS — R5383 Other fatigue: Secondary | ICD-10-CM | POA: Diagnosis not present

## 2019-03-26 DIAGNOSIS — Z6839 Body mass index (BMI) 39.0-39.9, adult: Secondary | ICD-10-CM | POA: Diagnosis not present

## 2019-03-26 DIAGNOSIS — S90811A Abrasion, right foot, initial encounter: Secondary | ICD-10-CM | POA: Diagnosis not present

## 2019-03-26 DIAGNOSIS — E1165 Type 2 diabetes mellitus with hyperglycemia: Secondary | ICD-10-CM | POA: Diagnosis not present

## 2019-03-26 DIAGNOSIS — Z299 Encounter for prophylactic measures, unspecified: Secondary | ICD-10-CM | POA: Diagnosis not present

## 2019-03-26 DIAGNOSIS — R8290 Unspecified abnormal findings in urine: Secondary | ICD-10-CM | POA: Diagnosis not present

## 2019-03-26 DIAGNOSIS — J302 Other seasonal allergic rhinitis: Secondary | ICD-10-CM | POA: Diagnosis not present

## 2019-03-26 DIAGNOSIS — R42 Dizziness and giddiness: Secondary | ICD-10-CM | POA: Diagnosis not present

## 2019-05-25 DIAGNOSIS — A6920 Lyme disease, unspecified: Secondary | ICD-10-CM | POA: Diagnosis not present

## 2019-05-25 DIAGNOSIS — M2550 Pain in unspecified joint: Secondary | ICD-10-CM | POA: Diagnosis not present

## 2019-05-25 DIAGNOSIS — Z299 Encounter for prophylactic measures, unspecified: Secondary | ICD-10-CM | POA: Diagnosis not present

## 2019-05-25 DIAGNOSIS — E1165 Type 2 diabetes mellitus with hyperglycemia: Secondary | ICD-10-CM | POA: Diagnosis not present

## 2019-05-25 DIAGNOSIS — Z6839 Body mass index (BMI) 39.0-39.9, adult: Secondary | ICD-10-CM | POA: Diagnosis not present

## 2019-06-07 DIAGNOSIS — Z905 Acquired absence of kidney: Secondary | ICD-10-CM | POA: Diagnosis not present

## 2019-06-07 DIAGNOSIS — N3946 Mixed incontinence: Secondary | ICD-10-CM | POA: Diagnosis not present

## 2019-06-07 DIAGNOSIS — Z85528 Personal history of other malignant neoplasm of kidney: Secondary | ICD-10-CM | POA: Diagnosis not present

## 2019-06-07 DIAGNOSIS — N952 Postmenopausal atrophic vaginitis: Secondary | ICD-10-CM | POA: Diagnosis not present

## 2019-06-07 DIAGNOSIS — N3281 Overactive bladder: Secondary | ICD-10-CM | POA: Diagnosis not present

## 2019-06-07 DIAGNOSIS — Z87440 Personal history of urinary (tract) infections: Secondary | ICD-10-CM | POA: Diagnosis not present

## 2019-06-22 DIAGNOSIS — Z85528 Personal history of other malignant neoplasm of kidney: Secondary | ICD-10-CM | POA: Diagnosis not present

## 2019-07-10 ENCOUNTER — Emergency Department (HOSPITAL_COMMUNITY): Payer: Medicare Other

## 2019-07-10 ENCOUNTER — Encounter (HOSPITAL_COMMUNITY): Payer: Self-pay

## 2019-07-10 ENCOUNTER — Observation Stay (HOSPITAL_COMMUNITY)
Admission: EM | Admit: 2019-07-10 | Discharge: 2019-07-11 | Disposition: A | Discharge status: Home / Self Care | Payer: Medicare Other | Attending: Internal Medicine | Admitting: Internal Medicine

## 2019-07-10 ENCOUNTER — Other Ambulatory Visit: Payer: Self-pay

## 2019-07-10 DIAGNOSIS — R0689 Other abnormalities of breathing: Secondary | ICD-10-CM | POA: Diagnosis not present

## 2019-07-10 DIAGNOSIS — Z85528 Personal history of other malignant neoplasm of kidney: Secondary | ICD-10-CM | POA: Diagnosis not present

## 2019-07-10 DIAGNOSIS — Z20828 Contact with and (suspected) exposure to other viral communicable diseases: Secondary | ICD-10-CM | POA: Diagnosis not present

## 2019-07-10 DIAGNOSIS — R072 Precordial pain: Secondary | ICD-10-CM | POA: Diagnosis not present

## 2019-07-10 DIAGNOSIS — R079 Chest pain, unspecified: Secondary | ICD-10-CM | POA: Diagnosis present

## 2019-07-10 DIAGNOSIS — R0789 Other chest pain: Secondary | ICD-10-CM | POA: Diagnosis not present

## 2019-07-10 DIAGNOSIS — Z87891 Personal history of nicotine dependence: Secondary | ICD-10-CM | POA: Diagnosis not present

## 2019-07-10 DIAGNOSIS — R0902 Hypoxemia: Secondary | ICD-10-CM | POA: Diagnosis not present

## 2019-07-10 DIAGNOSIS — E669 Obesity, unspecified: Secondary | ICD-10-CM | POA: Diagnosis present

## 2019-07-10 LAB — CBC
HCT: 45.4 % (ref 36.0–46.0)
Hemoglobin: 14.9 g/dL (ref 12.0–15.0)
MCH: 29.5 pg (ref 26.0–34.0)
MCHC: 32.8 g/dL (ref 30.0–36.0)
MCV: 89.9 fL (ref 80.0–100.0)
Platelets: 274 10*3/uL (ref 150–400)
RBC: 5.05 MIL/uL (ref 3.87–5.11)
RDW: 13.6 % (ref 11.5–15.5)
WBC: 10.9 10*3/uL — ABNORMAL HIGH (ref 4.0–10.5)
nRBC: 0 % (ref 0.0–0.2)

## 2019-07-10 LAB — BASIC METABOLIC PANEL
Anion gap: 7 (ref 5–15)
BUN: 18 mg/dL (ref 8–23)
CO2: 25 mmol/L (ref 22–32)
Calcium: 8.8 mg/dL — ABNORMAL LOW (ref 8.9–10.3)
Chloride: 105 mmol/L (ref 98–111)
Creatinine, Ser: 0.97 mg/dL (ref 0.44–1.00)
GFR calc Af Amer: >60 mL/min (ref >60)
GFR calc non Af Amer: 57 mL/min — ABNORMAL LOW (ref >60)
Glucose, Bld: 110 mg/dL — ABNORMAL HIGH (ref 70–99)
Potassium: 4.3 mmol/L (ref 3.5–5.1)
Sodium: 137 mmol/L (ref 135–145)

## 2019-07-10 LAB — TROPONIN I (HIGH SENSITIVITY)
Troponin I (High Sensitivity): 3 ng/L (ref <18)
Troponin I (High Sensitivity): 3 ng/L (ref <18)

## 2019-07-10 LAB — SARS CORONAVIRUS 2 BY RT PCR (HOSPITAL ORDER, PERFORMED IN ~~LOC~~ HOSPITAL LAB): SARS Coronavirus 2: NEGATIVE

## 2019-07-10 MED ORDER — ONDANSETRON HCL 4 MG/2ML IJ SOLN: 4.0000 mg | Freq: Four times a day (QID) | INTRAMUSCULAR | Status: DC | PRN

## 2019-07-10 MED ORDER — ENOXAPARIN SODIUM 60 MG/0.6ML ~~LOC~~ SOLN
60.0000 mg | SUBCUTANEOUS | Status: DC
  Administered 2019-07-10: 23:00:00 60 mg via SUBCUTANEOUS
  Filled 2019-07-10: qty 0.6

## 2019-07-10 MED ORDER — DIPHENHYDRAMINE HCL 25 MG PO CAPS
50.0000 mg | ORAL_CAPSULE | Freq: Every day | ORAL | Status: DC
  Administered 2019-07-10: 23:00:00 50 mg via ORAL
  Filled 2019-07-10 (×3): qty 2

## 2019-07-10 MED ORDER — SODIUM CHLORIDE 0.9% FLUSH: 3.0000 mL | Freq: Once | INTRAVENOUS | Status: DC

## 2019-07-10 MED ORDER — ASPIRIN EC 81 MG PO TBEC
81.0000 mg | DELAYED_RELEASE_TABLET | Freq: Every day | ORAL | Status: DC
  Administered 2019-07-11: 09:00:00 81 mg via ORAL
  Filled 2019-07-10: qty 1

## 2019-07-10 MED ORDER — ACETAMINOPHEN 325 MG PO TABS: 650.0000 mg | ORAL_TABLET | ORAL | Status: DC | PRN

## 2019-07-10 NOTE — ED Provider Notes (Signed)
[facility] EMERGENCY DEPARTMENT Provider Note   CSN: [id number] Arrival date & time: 07/10/19  1505    History   Chief Complaint Chief Complaint  Patient presents with  . Chest Pain    HPI Kashonda Sarkisyan is a 78 y.o. female.  HPI: A 78 year old patient with a history of obesity presents for evaluation of chest pain. Initial onset of pain was approximately 1-3 hours ago. The patient's chest pain is described as heaviness/pressure/tightness and is not worse with exertion. The patient complains of nausea and reports some diaphoresis. The patient's chest pain is middle- or left-sided, is not well-localized, is not sharp and does not radiate to the arms/jaw/neck. The patient has no history of stroke, has no history of peripheral artery disease, has not smoked in the past 90 days, denies any history of treated diabetes, has no relevant family history of coronary artery disease (first degree relative at less than age 47), is not hypertensive and has no history of hypercholesterolemia.   HPI  Past Medical History:  Diagnosis Date  . Anemia    blood transfusion "many years ago before 1970"  . Anxiety   . Arthritis   . Cancer [facility])    kidney cancer  . History of hiatal hernia 2009   incisional hernia  . Pneumonia    years ago, possibly age 25  . Pre-diabetes     Patient Active Problem List   Diagnosis Date Noted  . Chest pain 07/10/2019  . Obesity 07/10/2019  . 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 ABDOMINAL HYSTERECTOMY     1971  . COLONOSCOPY W/ POLYPECTOMY  2017  . LAPAROSCOPIC GASTRIC SLEEVE RESECTION  2008  . NEPHRECTOMY Right 2008  . TONSILLECTOMY     childhood  . 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 TRIGGER FINGER RELEASE       OB History   No obstetric history on  file.      Home Medications    Prior to Admission medications   Medication Sig Start Date End Date Taking? Authorizing Provider  cholecalciferol (VITAMIN D3) 25 MCG (1000 UT) tablet Take 1,000 Units by mouth daily.   Yes [provider]  diphenhydrAMINE (BENADRYL) 25 MG tablet Take 50 mg by mouth at bedtime.    Yes [provider]  aspirin EC 81 MG EC tablet Take 1 tablet (81 mg total) by mouth daily. 07/12/19   Elgergawy, Silver Huguenin, MD    Family History No family history on file.  Social History Social History   Tobacco Use  . Smoking status: Former Smoker    Types: Cigarettes  . Smokeless tobacco: Never Used  . Tobacco comment: 36 years   Substance Use Topics  . Alcohol use: No  . Drug use: No     Allergies   Patient has no known allergies.   Review of Systems Review of Systems  Constitutional: Positive for diaphoresis.  Cardiovascular: Positive for chest pain.  Gastrointestinal: Positive for nausea and vomiting.  Allergic/Immunologic: Negative for immunocompromised state.  All other systems reviewed and are negative.    Physical Exam Updated Vital Signs BP 130/74 (BP Location: Left Arm)   Pulse 84   Temp 97.6 F (36.4 C) (Oral)   Resp 18   Ht 5\' 7"  (1.702 m)   Wt 113.4 kg  SpO2 95%   BMI 39.16 kg/m   Physical Exam Vitals signs and nursing note reviewed.  Constitutional:      Appearance: She is well-developed.  HENT:     Head: Normocephalic and atraumatic.  Eyes:     Pupils: Pupils are equal, round, and reactive to light.  Neck:     Musculoskeletal: Neck supple.  Cardiovascular:     Rate and Rhythm: Normal rate and regular rhythm.     Heart sounds: Normal heart sounds. No murmur.  Pulmonary:     Effort: Pulmonary effort is normal. No respiratory distress.  Abdominal:     General: There is no distension.     Palpations: Abdomen is soft.     Tenderness: There is no abdominal tenderness. There is no guarding or rebound.   Musculoskeletal:     Right lower leg: No edema.     Left lower leg: No edema.  Skin:    General: Skin is warm and dry.  Neurological:     Mental Status: She is alert and oriented to person, place, and time.      ED Treatments / Results  Labs (all labs ordered are listed, but only abnormal results are displayed) Labs Reviewed  BASIC METABOLIC PANEL - Abnormal; Notable for the following components:      Result Value   Glucose, Bld 110 (*)    Calcium 8.8 (*)    GFR calc non Af Amer 57 (*)    All other components within normal limits  CBC - Abnormal; Notable for the following components:   WBC 10.9 (*)    All other components within normal limits  LIPID PANEL - Abnormal; Notable for the following components:   LDL Cholesterol 115 (*)    All other components within normal limits  SARS CORONAVIRUS 2 (HOSPITAL ORDER, [place] [facility])  HEMOGLOBIN A1C  TROPONIN I (HIGH SENSITIVITY)  TROPONIN I (HIGH SENSITIVITY)  TROPONIN I (HIGH SENSITIVITY)    EKG EKG Interpretation  Date/Time:  Saturday July 10 2019 15:19:51 EDT Ventricular Rate:  82 PR Interval:    QRS Duration: 97 QT Interval:  375 QTC Calculation: 438 R Axis:   -8 Text Interpretation:  Sinus or ectopic atrial rhythm Low voltage, precordial leads Abnormal R-wave progression, late transition No acute changes No old tracing to compare Confirmed by Varney Biles [phone]) on 07/10/2019 3:30:43 PM   Radiology Dg Chest 2 View  Result Date: 07/10/2019 CLINICAL DATA:  Chest pain.  Recent Lyme disease EXAM: CHEST - 2 VIEW COMPARISON:  January 10, 2016 FINDINGS: Lungs are clear. Heart size and pulmonary vascularity are normal. No adenopathy. No bone lesions. IMPRESSION: No edema or consolidation. Electronically Signed   By: Lowella Grip III M.D.   On: 07/10/2019 15:55    Procedures Procedures (including critical care time)  Medications Ordered in ED Medications  diphenhydrAMINE (BENADRYL)  capsule 50 mg (50 mg Oral Given 07/10/19 2238)  acetaminophen (TYLENOL) tablet 650 mg (has no administration in time range)  ondansetron (ZOFRAN) injection 4 mg (has no administration in time range)  enoxaparin (LOVENOX) injection 60 mg (60 mg Subcutaneous Given 07/10/19 2238)  aspirin EC tablet 81 mg (81 mg Oral Given 07/11/19 0927)     Initial Impression / Assessment and Plan / ED Course  I have reviewed the triage vital signs and the nursing notes.  Pertinent labs & imaging results that were available during my care of the patient were reviewed by me and considered in my  medical decision making (see chart for details).     HEAR Score: 85  78 year old female comes in a chief complaint of chest pain.  She reports that she started having midsternal chest pain all of a sudden described as elephant sitting on her.  The pain lasted for 30 minutes and resolved after she got aspirin.  She had associated diaphoresis, nausea and she is having some shortness of breath because it hurts more when she takes a deep breath.  She has no PE, DVT risk factors and clinical history, besides the pleuritic component to the pain, is not suggestive of PE.  The pain does not appear to be gastritis or musculoskeletal in nature.  Perhaps she could have had severe esophageal spasm that could have caused the momentary pain.  Chest x-ray is negative.  EKG is not showing any acute findings.  Hear score is 5.  Initial troponin is normal.  Given that the pain is described as heaviness over the chest with radiation to the neck and jaw, we will admit her for ACS rule out despite the normal appearing high-sensitivity troponin.  Final Clinical Impressions(s) / ED Diagnoses   Final diagnoses:  Precordial chest pain    ED Discharge Orders         Ordered    aspirin EC 81 MG EC tablet  Daily     07/11/19 1308    Increase activity slowly     07/11/19 1308    Discharge instructions    Comments: Follow with Primary MD Monico Blitz, MD in 7 days   Get CBC, CMP,  checked  by Primary MD next visit.    Activity: As tolerated with Full fall precautions use walker/cane & assistance as needed   Disposition Home    Diet: Heart Healthy    On your next visit with your primary care physician please Get Medicines reviewed and adjusted.   Please request your Prim.MD to go over all Hospital Tests and Procedure/Radiological results at the follow up, please get all Hospital records sent to your Prim MD by signing hospital release before you go home.   If you experience worsening of your admission symptoms, develop shortness of breath, life threatening emergency, suicidal or homicidal thoughts you must seek medical attention immediately by calling 911 or calling your MD immediately  if symptoms less severe.  You Must read complete instructions/literature along with all the possible adverse reactions/side effects for all the Medicines you take and that have been prescribed to you. Take any new Medicines after you have completely understood and accpet all the possible adverse reactions/side effects.   Do not drive, operating heavy machinery, perform activities at heights, swimming or participation in water activities or provide baby sitting services if your were admitted for syncope or siezures until you have seen by Primary MD or a Neurologist and advised to do so again.  Do not drive when taking Pain medications.    Do not take more than prescribed Pain, Sleep and Anxiety Medications  Special Instructions: If you have smoked or chewed Tobacco  in the last 2 yrs please stop smoking, stop any regular Alcohol  and or any Recreational drug use.  Wear Seat belts while driving.   Please note  You were cared for by a hospitalist during your hospital stay. If you have any questions about your discharge medications or the care you received while you were in the hospital after you are discharged, you can call the unit and  asked  to speak with the hospitalist on call if the hospitalist that took care of you is not available. Once you are discharged, your primary care physician will handle any further medical issues. Please note that NO REFILLS for any discharge medications will be authorized once you are discharged, as it is imperative that you return to your primary care physician (or establish a relationship with a primary care physician if you do not have one) for your aftercare needs so that they can reassess your need for medications and monitor your lab values.   07/11/19 [place], Teeghan Hammer, MD 07/11/19 1428

## 2019-07-10 NOTE — ED Triage Notes (Signed)
PT arrived via EMS.  Reports history of lyme's disease x 4 months.  Reports was mowing the yard around 1200 today.  WEnt inside to cool off and she started having chest pain 8/10.  Reports pain was 6/10 when ems arrived.  Reports pain was sudden and dull.  Reports pain was not reproducible with palpation.  Pt had 4 baby asa by ems.  Pain 1/10 at this time.  Reports sob initially but none at this time.  Pt has a cough but says is chronic.

## 2019-07-10 NOTE — ED Notes (Signed)
ED Provider at bedside. 

## 2019-07-10 NOTE — H&P (Signed)
History and Physical  Carol Doyle TDD:[id number] DOB: 02-21-1941 DOA: 07/10/2019  Referring physician: Dr. Kathrynn Humble, ED physician PCP: Monico Blitz, MD  Outpatient Specialists:   Patient Coming From: Home  Chief Complaint: Chest pain  HPI: Carol Doyle is a 78 y.o. female with a history of prediabetes and obesity.  Patient experienced substernal left chest pain with nausea and diaphoresis.  Pain lasted for approximately 30 minutes and improved after the patient was given aspirin by EMS.  Patient did not receive a nitroglycerin as chest pain was starting to resolve.  Chest pain was clearly resolved upon arrival to the emergency department.  No other palliating or provoking factors.  Patient has been active earlier in the day and had been working outside prior to the onset of her chest pain.  Chest pain never happened before.  Heart score 6  Emergency Department Course: High-sensitivity troponins x2 normal with 0 delta.  Review of Systems:   Pt denies any fevers, chills, nausea, vomiting, diarrhea, constipation, abdominal pain, shortness of breath, dyspnea on exertion, orthopnea, cough, wheezing, palpitations, headache, vision changes, lightheadedness, dizziness, melena, rectal bleeding.  Review of systems are otherwise negative  Past Medical History:  Diagnosis Date  . Anemia    blood transfusion "many years ago before 1970"  . Anxiety   . Arthritis   . Cancer [facility])    kidney cancer  . History of hiatal hernia 2009   incisional hernia  . Pneumonia    years ago, possibly age 35  . Pre-diabetes    Past Surgical History:  Procedure Laterality Date  . ABDOMINAL HYSTERECTOMY     1971  . COLONOSCOPY W/ POLYPECTOMY  2017  . LAPAROSCOPIC GASTRIC SLEEVE RESECTION  2008  . NEPHRECTOMY Right 2008  . TONSILLECTOMY     childhood  . TOTAL KNEE ARTHROPLASTY Left 01/06/2017   Procedure: LEFT TOTAL KNEE ARTHROPLASTY;  Surgeon: Gaynelle Arabian, MD;  Location: [facility];  Service:  Orthopedics;  Laterality: Left;  . TRIGGER FINGER RELEASE     Social History:  reports that she has quit smoking. Her smoking use included cigarettes. She has never used smokeless tobacco. She reports that she does not drink alcohol or use drugs. Patient lives at home  No Known Allergies  No family history on file.  No family history of early MI  Prior to Admission medications   Medication Sig Start Date End Date Taking? Authorizing Provider  cholecalciferol (VITAMIN D3) 25 MCG (1000 UT) tablet Take 1,000 Units by mouth daily.   Yes [provider]  diphenhydrAMINE (BENADRYL) 25 MG tablet Take 50 mg by mouth at bedtime.    Yes [provider]    Physical Exam: BP (!) 113/54   Pulse 73   Temp 98 F (36.7 C) (Oral)   Resp 19   Ht 5\' 7"  (1.702 m)   Wt 113.4 kg   SpO2 95%   BMI 39.16 kg/m   . General: Elderly Caucasian female. Awake and alert and oriented x3. No acute cardiopulmonary distress.  Marland Kitchen HEENT: Normocephalic atraumatic.  Right and left ears normal in appearance.  Pupils equal, round, reactive to light. Extraocular muscles are intact. Sclerae anicteric and noninjected.  Moist mucosal membranes. No mucosal lesions.  . Neck: Neck supple without lymphadenopathy. No carotid bruits. No masses palpated.  . Cardiovascular: Regular rate with normal S1-S2 sounds. No murmurs, rubs, gallops auscultated. No JVD.  Marland Kitchen Respiratory: Good respiratory effort with no wheezes, rales, rhonchi. Lungs clear to auscultation bilaterally.  No  accessory muscle use. . Abdomen: Soft, nontender, nondistended. Active bowel sounds. No masses or hepatosplenomegaly  . Skin: No rashes, lesions, or ulcerations.  Dry, warm to touch. 2+ dorsalis pedis and radial pulses. . Musculoskeletal: No calf or leg pain. All major joints not erythematous nontender.  No upper or lower joint deformation.  Good ROM.  No contractures  . Psychiatric: Intact judgment and insight. Pleasant and cooperative. .  Neurologic: No focal neurological deficits. Strength is 5/5 and symmetric in upper and lower extremities.  Cranial nerves II through XII are grossly intact.           Labs on Admission: I have personally reviewed following labs and imaging studies  CBC: Recent Labs  Lab 07/10/19 1557  WBC 10.9*  HGB 14.9  HCT 45.4  MCV 89.9  PLT 638   Basic Metabolic Panel: Recent Labs  Lab 07/10/19 1557  NA 137  K 4.3  CL 105  CO2 25  GLUCOSE 110*  BUN 18  CREATININE 0.97  CALCIUM 8.8*   GFR: Estimated Creatinine Clearance: 64.1 mL/min (by C-G formula based on SCr of 0.97 mg/dL). Liver Function Tests: No results for input(s): AST, ALT, ALKPHOS, BILITOT, PROT, ALBUMIN in the last 168 hours. No results for input(s): LIPASE, AMYLASE in the last 168 hours. No results for input(s): AMMONIA in the last 168 hours. Coagulation Profile: No results for input(s): INR, PROTIME in the last 168 hours. Cardiac Enzymes: No results for input(s): CKTOTAL, CKMB, CKMBINDEX, TROPONINI in the last 168 hours. BNP (last 3 results) No results for input(s): PROBNP in the last 8760 hours. HbA1C: No results for input(s): HGBA1C in the last 72 hours. CBG: No results for input(s): GLUCAP in the last 168 hours. Lipid Profile: No results for input(s): CHOL, HDL, LDLCALC, TRIG, CHOLHDL, LDLDIRECT in the last 72 hours. Thyroid Function Tests: No results for input(s): TSH, T4TOTAL, FREET4, T3FREE, THYROIDAB in the last 72 hours. Anemia Panel: No results for input(s): VITAMINB12, FOLATE, FERRITIN, TIBC, IRON, RETICCTPCT in the last 72 hours. Urine analysis: No results found for: COLORURINE, APPEARANCEUR, LABSPEC, PHURINE, GLUCOSEU, HGBUR, BILIRUBINUR, KETONESUR, PROTEINUR, UROBILINOGEN, NITRITE, LEUKOCYTESUR Sepsis Labs: @LABRCNTIP (procalcitonin:4,lacticidven:4) )No results found for this or any previous visit (from the past 240 hour(s)).   Radiological Exams on Admission: Dg Chest 2 View  Result Date:  07/10/2019 CLINICAL DATA:  Chest pain.  Recent Lyme disease EXAM: CHEST - 2 VIEW COMPARISON:  January 10, 2016 FINDINGS: Lungs are clear. Heart size and pulmonary vascularity are normal. No adenopathy. No bone lesions. IMPRESSION: No edema or consolidation. Electronically Signed   By: Lowella Grip III M.D.   On: 07/10/2019 15:55    EKG: Independently reviewed.  Sinus rhythm with no acute ST changes.  Assessment/Plan: Principal Problem:   Chest pain Active Problems:   Obesity    This patient was discussed with the ED physician, including pertinent vitals, physical exam findings, labs, and imaging.  We also discussed care given by the ED provider.  1. Chest pain a. Observation b. Telemetry monitoring c. Heart score of 6 d. High-sensitivity troponins negative x2 with delta of 0. e. Will likely need close follow-up with cardiology after discharge 2. Obesity 3. Prediabetes a. Check hemoglobin A1c  DVT prophylaxis: Lovenox Consultants: None Code Status: Full code Family Communication: None Disposition Plan: Home following observation   Truett Mainland, DO

## 2019-07-11 ENCOUNTER — Observation Stay (HOSPITAL_BASED_OUTPATIENT_CLINIC_OR_DEPARTMENT_OTHER): Payer: Medicare Other

## 2019-07-11 DIAGNOSIS — R072 Precordial pain: Secondary | ICD-10-CM | POA: Diagnosis not present

## 2019-07-11 DIAGNOSIS — R079 Chest pain, unspecified: Secondary | ICD-10-CM | POA: Diagnosis not present

## 2019-07-11 DIAGNOSIS — Z87891 Personal history of nicotine dependence: Secondary | ICD-10-CM | POA: Diagnosis not present

## 2019-07-11 DIAGNOSIS — Z85528 Personal history of other malignant neoplasm of kidney: Secondary | ICD-10-CM | POA: Diagnosis not present

## 2019-07-11 DIAGNOSIS — Z20828 Contact with and (suspected) exposure to other viral communicable diseases: Secondary | ICD-10-CM | POA: Diagnosis not present

## 2019-07-11 DIAGNOSIS — R0789 Other chest pain: Secondary | ICD-10-CM

## 2019-07-11 LAB — LIPID PANEL
Cholesterol: 187 mg/dL (ref 0–200)
HDL: 43 mg/dL (ref >40)
LDL Cholesterol: 115 mg/dL — ABNORMAL HIGH (ref 0–99)
Total CHOL/HDL Ratio: 4.3 RATIO
Triglycerides: 143 mg/dL (ref <150)
VLDL: 29 mg/dL (ref 0–40)

## 2019-07-11 LAB — ECHOCARDIOGRAM COMPLETE
Height: 67 in
Weight: 4000 oz

## 2019-07-11 LAB — HEMOGLOBIN A1C
Hgb A1c MFr Bld: 6 % — ABNORMAL HIGH (ref 4.8–5.6)
Mean Plasma Glucose: 125.5 mg/dL

## 2019-07-11 LAB — TROPONIN I (HIGH SENSITIVITY): Troponin I (High Sensitivity): 3 ng/L (ref <18)

## 2019-07-11 MED ORDER — ASPIRIN 81 MG PO TBEC: 81.0000 mg | DELAYED_RELEASE_TABLET | Freq: Every day | ORAL | 0 refills | Status: DC

## 2019-07-11 NOTE — Progress Notes (Signed)
Nsg Discharge Note  Admit Date:  07/10/2019 Discharge date: 07/11/2019   Carol Doyle to be D/C'd Home per MD order.  AVS completed.  Copy for chart, and copy for patient signed, and dated. Patient/caregiver able to verbalize understanding.  Discharge Medication: Allergies as of 07/11/2019   No Known Allergies     Medication List    TAKE these medications   aspirin 81 MG EC tablet Take 1 tablet (81 mg total) by mouth daily. Start taking on: July 12, 2019   cholecalciferol 25 MCG (1000 UT) tablet Commonly known as: VITAMIN D3 Take 1,000 Units by mouth daily.   diphenhydrAMINE 25 MG tablet Commonly known as: BENADRYL Take 50 mg by mouth at bedtime.       Discharge Assessment: Vitals:   07/11/19 0811 07/11/19 1230  BP: (!) 123/42 130/74  Pulse: 77 84  Resp: 18 18  Temp: 98.2 F (36.8 C) 97.6 F (36.4 C)  SpO2: 95% 95%   Skin clean, dry and intact without evidence of skin break down, no evidence of skin tears noted. IV catheter discontinued intact. Site without signs and symptoms of complications - no redness or edema noted at insertion site, patient denies c/o pain - only slight tenderness at site.  Dressing with slight pressure applied.  D/c Instructions-Education: Discharge instructions given to patient/family with verbalized understanding. D/c education completed with patient/family including follow up instructions, medication list, d/c activities limitations if indicated, with other d/c instructions as indicated by MD - patient able to verbalize understanding, all questions fully answered. Patient instructed to return to ED, call 911, or call MD for any changes in condition.  Patient escorted via [place], and D/C home via private auto.  Loa Socks, RN 07/11/2019 1:23 PM

## 2019-07-11 NOTE — Discharge Instructions (Signed)
Follow with Primary MD Monico Blitz, MD in 7 days   Get CBC, CMP,  checked  by Primary MD next visit.    Activity: As tolerated with Full fall precautions use walker/cane & assistance as needed   Disposition Home    Diet: Heart Healthy    On your next visit with your primary care physician please Get Medicines reviewed and adjusted.   Please request your Prim.MD to go over all Hospital Tests and Procedure/Radiological results at the follow up, please get all Hospital records sent to your Prim MD by signing hospital release before you go home.   If you experience worsening of your admission symptoms, develop shortness of breath, life threatening emergency, suicidal or homicidal thoughts you must seek medical attention immediately by calling 911 or calling your MD immediately  if symptoms less severe.  You Must read complete instructions/literature along with all the possible adverse reactions/side effects for all the Medicines you take and that have been prescribed to you. Take any new Medicines after you have completely understood and accpet all the possible adverse reactions/side effects.   Do not drive, operating heavy machinery, perform activities at heights, swimming or participation in water activities or provide baby sitting services if your were admitted for syncope or siezures until you have seen by Primary MD or a Neurologist and advised to do so again.  Do not drive when taking Pain medications.    Do not take more than prescribed Pain, Sleep and Anxiety Medications  Special Instructions: If you have smoked or chewed Tobacco  in the last 2 yrs please stop smoking, stop any regular Alcohol  and or any Recreational drug use.  Wear Seat belts while driving.   Please note  You were cared for by a hospitalist during your hospital stay. If you have any questions about your discharge medications or the care you received while you were in the hospital after you are discharged,  you can call the unit and asked to speak with the hospitalist on call if the hospitalist that took care of you is not available. Once you are discharged, your primary care physician will handle any further medical issues. Please note that NO REFILLS for any discharge medications will be authorized once you are discharged, as it is imperative that you return to your primary care physician (or establish a relationship with a primary care physician if you do not have one) for your aftercare needs so that they can reassess your need for medications and monitor your lab values.

## 2019-07-11 NOTE — Progress Notes (Signed)
*  PRELIMINARY RESULTS* Echocardiogram 2D Echocardiogram has been performed.  Carol Doyle 07/11/2019, 8:59 AM

## 2019-07-11 NOTE — Discharge Summary (Signed)
Carol Doyle, is a 78 y.o. female  DOB 1941-01-29  MRN [id number].  Admission date:  07/10/2019  Admitting Physician  Truett Mainland, DO  Discharge Date:  07/11/2019   Primary MD  Monico Blitz, MD  Recommendations for primary care physician for things to follow:  -Discontinue counseling about diet, exercise, and repeat lipid panel. -Patient will need stress test as an outpatient, referral has been sent to cardiology  Admission Diagnosis  Chest Pain   Discharge Diagnosis  Chest Pain   Principal Problem:   Chest pain Active Problems:   Obesity      Past Medical History:  Diagnosis Date   Anemia    blood transfusion "many years ago before 1970"   Anxiety    Arthritis    Cancer ([place])    kidney cancer   History of hiatal hernia 2009   incisional hernia   Pneumonia    years ago, possibly age 42   Pre-diabetes     Past Surgical History:  Procedure Laterality Date   ABDOMINAL HYSTERECTOMY     1971   COLONOSCOPY W/ POLYPECTOMY  2017   [place] RESECTION  2008   NEPHRECTOMY Right 2008   TONSILLECTOMY     childhood   TOTAL KNEE ARTHROPLASTY Left 01/06/2017   Procedure: LEFT TOTAL KNEE ARTHROPLASTY;  Surgeon: Gaynelle Arabian, MD;  Location: [facility];  Service: Orthopedics;  Laterality: Left;   TRIGGER FINGER RELEASE         History of present illness and  Hospital Course:     Kindly see H&P for history of present illness and admission details, please review complete Labs, Consult reports and Test reports for all details in brief  HPI  from the history and physical done on the day of admission 07/10/2019  Carol Doyle is a 78 y.o. female with a history of prediabetes and obesity.  Patient experienced substernal left chest pain with nausea and diaphoresis.  Pain lasted for approximately 30 minutes and improved after the patient was given aspirin by EMS.   Patient did not receive a nitroglycerin as chest pain was starting to resolve.  Chest pain was clearly resolved upon arrival to the emergency department.  No other palliating or provoking factors.  Patient has been active earlier in the day and had been working outside prior to the onset of her chest pain.  Chest pain never happened before  Hospital Course   Chest pain -Has some typical feature, as it was accompanied by nausea, diaphoresis, her high-sensitivity troponins negative x3, EKG nonacute, 2D echo was obtained, with a preserved EF, and no evidence of regional wall motion abnormalities LDL mildly elevated at 115, so patient will be discharged home today on aspirin, with recommendation to follow-up with cardiology as an outpatient to see if stress test will be needed as outpatient setting. -LDL is 115, discussed with patient about diet, so far no indication for statin.   Discharge Condition:  stable     Discharge Instructions  and  Discharge Medications  Discharge Instructions    Discharge instructions   Complete by: As directed    Follow with Primary MD Monico Blitz, MD in 7 days   Get CBC, CMP,  checked  by Primary MD next visit.    Activity: As tolerated with Full fall precautions use walker/cane & assistance as needed   Disposition Home    Diet: Heart Healthy    On your next visit with your primary care physician please Get Medicines reviewed and adjusted.   Please request your Prim.MD to go over all Hospital Tests and Procedure/Radiological results at the follow up, please get all Hospital records sent to your Prim MD by signing hospital release before you go home.   If you experience worsening of your admission symptoms, develop shortness of breath, life threatening emergency, suicidal or homicidal thoughts you must seek medical attention immediately by calling 911 or calling your MD immediately  if symptoms less severe.  You Must read complete  instructions/literature along with all the possible adverse reactions/side effects for all the Medicines you take and that have been prescribed to you. Take any new Medicines after you have completely understood and accpet all the possible adverse reactions/side effects.   Do not drive, operating heavy machinery, perform activities at heights, swimming or participation in water activities or provide baby sitting services if your were admitted for syncope or siezures until you have seen by Primary MD or a Neurologist and advised to do so again.  Do not drive when taking Pain medications.    Do not take more than prescribed Pain, Sleep and Anxiety Medications  Special Instructions: If you have smoked or chewed Tobacco  in the last 2 yrs please stop smoking, stop any regular Alcohol  and or any Recreational drug use.  Wear Seat belts while driving.   Please note  You were cared for by a hospitalist during your hospital stay. If you have any questions about your discharge medications or the care you received while you were in the hospital after you are discharged, you can call the unit and asked to speak with the hospitalist on call if the hospitalist that took care of you is not available. Once you are discharged, your primary care physician will handle any further medical issues. Please note that NO REFILLS for any discharge medications will be authorized once you are discharged, as it is imperative that you return to your primary care physician (or establish a relationship with a primary care physician if you do not have one) for your aftercare needs so that they can reassess your need for medications and monitor your lab values.   Increase activity slowly   Complete by: As directed      Allergies as of 07/11/2019   No Known Allergies     Medication List    TAKE these medications   aspirin 81 MG EC tablet Take 1 tablet (81 mg total) by mouth daily. Start taking on: July 12, 2019     cholecalciferol 25 MCG (1000 UT) tablet Commonly known as: VITAMIN D3 Take 1,000 Units by mouth daily.   diphenhydrAMINE 25 MG tablet Commonly known as: BENADRYL Take 50 mg by mouth at bedtime.         Diet and Activity recommendation: See Discharge Instructions above   Consults obtained - None   Major procedures and Radiology Reports - PLEASE review detailed and final reports for all details, in brief -     Dg Chest 2 View  Result Date:  07/10/2019 CLINICAL DATA:  Chest pain.  Recent Lyme disease EXAM: CHEST - 2 VIEW COMPARISON:  January 10, 2016 FINDINGS: Lungs are clear. Heart size and pulmonary vascularity are normal. No adenopathy. No bone lesions. IMPRESSION: No edema or consolidation. Electronically Signed   By: Lowella Grip III M.D.   On: 07/10/2019 15:55    Micro Results   Recent Results (from the past 240 hour(s))  SARS Coronavirus 2 [facility] order, Performed in [facility] hospital lab)     Status: None   Collection Time: 07/10/19  6:40 PM  Result Value Ref Range Status   SARS Coronavirus 2 NEGATIVE NEGATIVE Final    Comment: (NOTE) If result is NEGATIVE SARS-CoV-2 target nucleic acids are NOT DETECTED. The SARS-CoV-2 RNA is generally detectable in upper and lower  respiratory specimens during the acute phase of infection. The lowest  concentration of SARS-CoV-2 viral copies this assay can detect is 250  copies / mL. A negative result does not preclude SARS-CoV-2 infection  and should not be used as the sole basis for treatment or other  patient management decisions.  A negative result may occur with  improper specimen collection / handling, submission of specimen other  than nasopharyngeal swab, presence of viral mutation(s) within the  areas targeted by this assay, and inadequate number of viral copies  (<250 copies / mL). A negative result must be combined with clinical  observations, patient history, and epidemiological information. If result is  POSITIVE SARS-CoV-2 target nucleic acids are DETECTED. The SARS-CoV-2 RNA is generally detectable in upper and lower  respiratory specimens dur ing the acute phase of infection.  Positive  results are indicative of active infection with SARS-CoV-2.  Clinical  correlation with patient history and other diagnostic information is  necessary to determine patient infection status.  Positive results do  not rule out bacterial infection or co-infection with other viruses. If result is PRESUMPTIVE POSTIVE SARS-CoV-2 nucleic acids MAY BE PRESENT.   A presumptive positive result was obtained on the submitted specimen  and confirmed on repeat testing.  While 2019 novel coronavirus  (SARS-CoV-2) nucleic acids may be present in the submitted sample  additional confirmatory testing may be necessary for epidemiological  and / or clinical management purposes  to differentiate between  SARS-CoV-2 and other Sarbecovirus currently known to infect humans.  If clinically indicated additional testing with an alternate test  methodology [phone]) is advised. The SARS-CoV-2 RNA is generally  detectable in upper and lower respiratory sp ecimens during the acute  phase of infection. The expected result is Negative. Fact Sheet for Patients:  StrictlyIdeas.no Fact Sheet for Healthcare Providers: BankingDealers.co.za This test is not yet approved or cleared by the [place] FDA and has been authorized for detection and/or diagnosis of SARS-CoV-2 by FDA under an Emergency Use Authorization (EUA).  This EUA will remain in effect (meaning this test can be used) for the duration of the COVID-19 declaration under Section 564(b)(1) of the Act, 21 U.S.C. section 360bbb-3(b)(1), unless the authorization is terminated or revoked sooner. Performed at [facility], [address] [place], [place] [address]        Today   Subjective:   Carol Doyle today has  no headache,no  abdominal pain,no new weakness tingling or numbness, feels much better wants to go home today.  No further chest pain since patient was admitted  Objective:   Blood pressure 130/74, pulse 84, temperature 97.6 F (36.4 C), temperature source Oral, resp. rate 18, height 5\' 7"  (  1.702 m), weight 113.4 kg, SpO2 95 %.   Intake/Output Summary (Last 24 hours) at 07/11/2019 1313 Last data filed at 07/11/2019 1241 Gross per 24 hour  Intake 480 ml  Output --  Net 480 ml    Exam Awake Alert, Oriented x 3, No new F.N deficits, Normal affect Symmetrical Chest wall movement, Good air movement bilaterally, CTAB RRR,No Gallops,Rubs or new Murmurs, No Parasternal Heave +ve B.Sounds, Abd Soft, Non tender,  No rebound -guarding or rigidity. No Cyanosis, Clubbing or edema, No new Rash or bruise  Data Review   CBC w Diff:  Lab Results  Component Value Date   WBC 10.9 (H) 07/10/2019   HGB 14.9 07/10/2019   HCT 45.4 07/10/2019   PLT 274 07/10/2019    CMP:  Lab Results  Component Value Date   NA 137 07/10/2019   K 4.3 07/10/2019   CL 105 07/10/2019   CO2 25 07/10/2019   BUN 18 07/10/2019   CREATININE 0.97 07/10/2019   PROT 6.8 12/30/2016   ALBUMIN 3.4 (L) 12/30/2016   BILITOT 0.8 12/30/2016   ALKPHOS 57 12/30/2016   AST 19 12/30/2016   ALT 16 12/30/2016  .   Total Time in preparing paper work, data evaluation and todays exam - 53 minutes  Phillips Climes M.D on 07/11/2019 at 1:13 PM  [facility]   Office  [phone]

## 2019-08-05 ENCOUNTER — Ambulatory Visit: Payer: Medicare Other | Admitting: Student

## 2019-10-11 DIAGNOSIS — Z299 Encounter for prophylactic measures, unspecified: Secondary | ICD-10-CM | POA: Diagnosis not present

## 2019-10-11 DIAGNOSIS — Z87891 Personal history of nicotine dependence: Secondary | ICD-10-CM | POA: Diagnosis not present

## 2019-10-11 DIAGNOSIS — E1165 Type 2 diabetes mellitus with hyperglycemia: Secondary | ICD-10-CM | POA: Diagnosis not present

## 2019-10-11 DIAGNOSIS — Z6839 Body mass index (BMI) 39.0-39.9, adult: Secondary | ICD-10-CM | POA: Diagnosis not present

## 2019-10-11 DIAGNOSIS — E7800 Pure hypercholesterolemia, unspecified: Secondary | ICD-10-CM | POA: Diagnosis not present

## 2019-10-11 DIAGNOSIS — M1711 Unilateral primary osteoarthritis, right knee: Secondary | ICD-10-CM | POA: Diagnosis not present

## 2019-10-11 DIAGNOSIS — J45909 Unspecified asthma, uncomplicated: Secondary | ICD-10-CM | POA: Diagnosis not present

## 2019-10-27 DIAGNOSIS — M1711 Unilateral primary osteoarthritis, right knee: Secondary | ICD-10-CM | POA: Diagnosis not present

## 2019-10-27 DIAGNOSIS — M25461 Effusion, right knee: Secondary | ICD-10-CM | POA: Diagnosis not present

## 2019-10-27 DIAGNOSIS — M25561 Pain in right knee: Secondary | ICD-10-CM | POA: Diagnosis not present

## 2019-12-15 IMAGING — DX CHEST - 2 VIEW
2 series · 2 of 2 positions shown · non-contrast
Comparison: January 10, 2016

CLINICAL DATA: Chest pain.  Recent Lyme disease

EXAM:
CHEST - 2 VIEW

[chest pa]
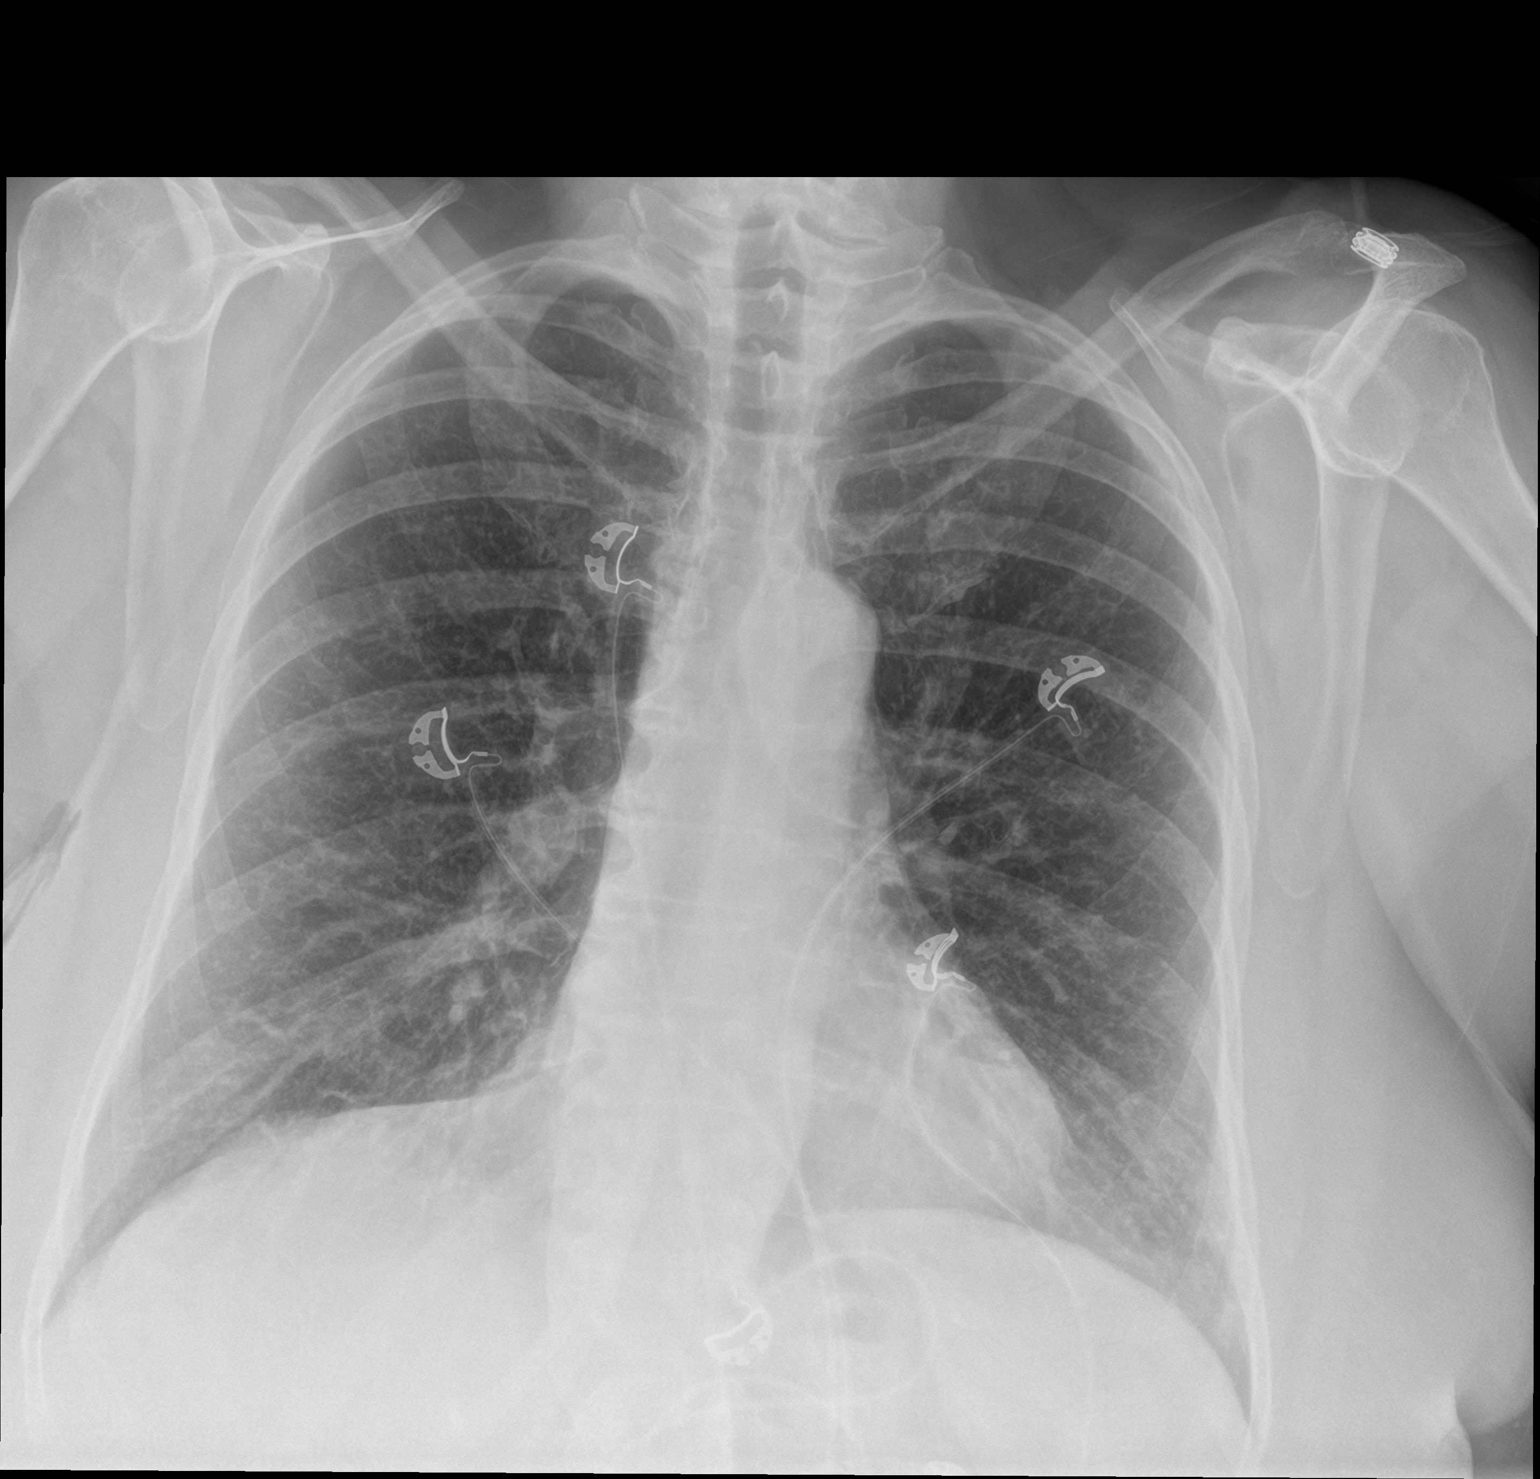

[chest lat]
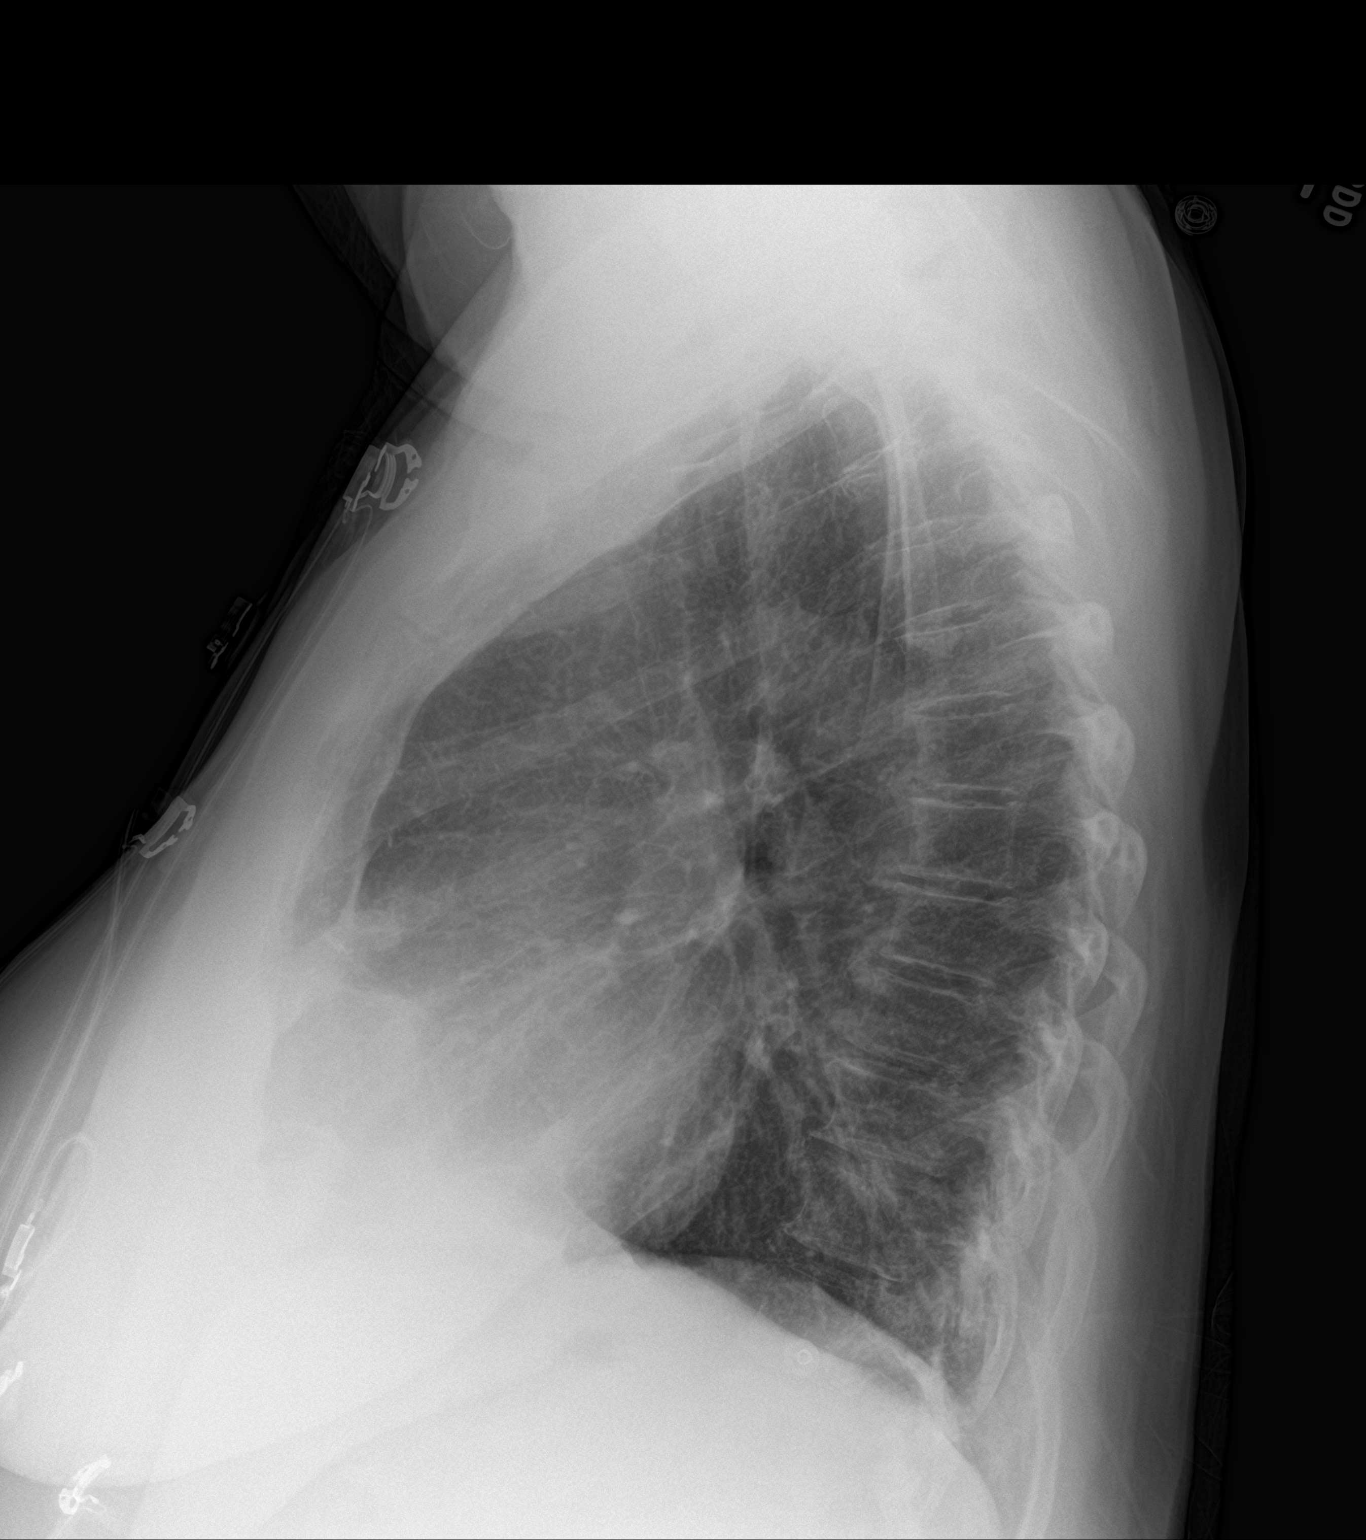

[2 of 2 positions shown; findings below may reference images not displayed]

FINDINGS: Lungs are clear. Heart size and pulmonary vascularity are normal. No
adenopathy. No bone lesions.
IMPRESSION: No edema or consolidation.

## 2019-12-21 DIAGNOSIS — E1165 Type 2 diabetes mellitus with hyperglycemia: Secondary | ICD-10-CM | POA: Diagnosis not present

## 2019-12-21 DIAGNOSIS — M1711 Unilateral primary osteoarthritis, right knee: Secondary | ICD-10-CM | POA: Diagnosis not present

## 2019-12-21 DIAGNOSIS — Z299 Encounter for prophylactic measures, unspecified: Secondary | ICD-10-CM | POA: Diagnosis not present

## 2019-12-21 DIAGNOSIS — E7800 Pure hypercholesterolemia, unspecified: Secondary | ICD-10-CM | POA: Diagnosis not present

## 2019-12-21 DIAGNOSIS — K219 Gastro-esophageal reflux disease without esophagitis: Secondary | ICD-10-CM | POA: Diagnosis not present

## 2019-12-21 DIAGNOSIS — Z6839 Body mass index (BMI) 39.0-39.9, adult: Secondary | ICD-10-CM | POA: Diagnosis not present

## 2019-12-21 DIAGNOSIS — Z87891 Personal history of nicotine dependence: Secondary | ICD-10-CM | POA: Diagnosis not present

## 2020-01-24 ENCOUNTER — Inpatient Hospital Stay: Admit: 2020-01-24 | Payer: Medicare Other | Admitting: Orthopedic Surgery

## 2020-01-24 SURGERY — ARTHROPLASTY, KNEE, TOTAL
Anesthesia: Choice | Site: Knee | Laterality: Right

## 2020-02-02 DIAGNOSIS — Z23 Encounter for immunization: Secondary | ICD-10-CM | POA: Diagnosis not present

## 2020-02-03 ENCOUNTER — Telehealth: Payer: Self-pay | Admitting: *Deleted

## 2020-02-03 NOTE — Telephone Encounter (Signed)
Inquiring if drive up vaccines are available for the handicapped? Will check and call patient back.

## 2020-02-04 NOTE — Telephone Encounter (Signed)
Call to patient- notified that there are wheelchairs available and shuttle service. Patient states she does not have anyone to come with her and she would prefer a drive thru service- she is going to check with [facility].

## 2020-02-07 DIAGNOSIS — Z299 Encounter for prophylactic measures, unspecified: Secondary | ICD-10-CM | POA: Diagnosis not present

## 2020-02-07 DIAGNOSIS — Z6839 Body mass index (BMI) 39.0-39.9, adult: Secondary | ICD-10-CM | POA: Diagnosis not present

## 2020-02-07 DIAGNOSIS — Z1331 Encounter for screening for depression: Secondary | ICD-10-CM | POA: Diagnosis not present

## 2020-02-07 DIAGNOSIS — Z0000 Encounter for general adult medical examination without abnormal findings: Secondary | ICD-10-CM | POA: Diagnosis not present

## 2020-02-07 DIAGNOSIS — E7800 Pure hypercholesterolemia, unspecified: Secondary | ICD-10-CM | POA: Diagnosis not present

## 2020-02-07 DIAGNOSIS — F419 Anxiety disorder, unspecified: Secondary | ICD-10-CM | POA: Diagnosis not present

## 2020-02-07 DIAGNOSIS — R5383 Other fatigue: Secondary | ICD-10-CM | POA: Diagnosis not present

## 2020-02-07 DIAGNOSIS — E1165 Type 2 diabetes mellitus with hyperglycemia: Secondary | ICD-10-CM | POA: Diagnosis not present

## 2020-02-07 DIAGNOSIS — Z1211 Encounter for screening for malignant neoplasm of colon: Secondary | ICD-10-CM | POA: Diagnosis not present

## 2020-02-07 DIAGNOSIS — Z7189 Other specified counseling: Secondary | ICD-10-CM | POA: Diagnosis not present

## 2020-02-07 DIAGNOSIS — Z1339 Encounter for screening examination for other mental health and behavioral disorders: Secondary | ICD-10-CM | POA: Diagnosis not present

## 2020-02-11 DIAGNOSIS — Z713 Dietary counseling and surveillance: Secondary | ICD-10-CM | POA: Diagnosis not present

## 2020-02-11 DIAGNOSIS — Z299 Encounter for prophylactic measures, unspecified: Secondary | ICD-10-CM | POA: Diagnosis not present

## 2020-02-11 DIAGNOSIS — R5383 Other fatigue: Secondary | ICD-10-CM | POA: Diagnosis not present

## 2020-02-11 DIAGNOSIS — R0789 Other chest pain: Secondary | ICD-10-CM | POA: Diagnosis not present

## 2020-02-11 DIAGNOSIS — Z6839 Body mass index (BMI) 39.0-39.9, adult: Secondary | ICD-10-CM | POA: Diagnosis not present

## 2020-02-11 DIAGNOSIS — E559 Vitamin D deficiency, unspecified: Secondary | ICD-10-CM | POA: Diagnosis not present

## 2020-02-11 DIAGNOSIS — K219 Gastro-esophageal reflux disease without esophagitis: Secondary | ICD-10-CM | POA: Diagnosis not present

## 2020-02-11 DIAGNOSIS — E7800 Pure hypercholesterolemia, unspecified: Secondary | ICD-10-CM | POA: Diagnosis not present

## 2020-02-11 DIAGNOSIS — Z79899 Other long term (current) drug therapy: Secondary | ICD-10-CM | POA: Diagnosis not present

## 2020-02-11 DIAGNOSIS — E1165 Type 2 diabetes mellitus with hyperglycemia: Secondary | ICD-10-CM | POA: Diagnosis not present

## 2020-02-11 DIAGNOSIS — F419 Anxiety disorder, unspecified: Secondary | ICD-10-CM | POA: Diagnosis not present

## 2020-02-12 ENCOUNTER — Emergency Department (HOSPITAL_COMMUNITY)
Admission: EM | Admit: 2020-02-12 | Discharge: 2020-02-13 | Disposition: A | Discharge status: Home / Self Care | Payer: Medicare Other | Source: Home / Self Care | Attending: Emergency Medicine | Admitting: Emergency Medicine

## 2020-02-12 ENCOUNTER — Other Ambulatory Visit: Payer: Self-pay

## 2020-02-12 ENCOUNTER — Encounter (HOSPITAL_COMMUNITY): Payer: Self-pay

## 2020-02-12 ENCOUNTER — Emergency Department (HOSPITAL_COMMUNITY): Payer: Medicare Other

## 2020-02-12 DIAGNOSIS — K8020 Calculus of gallbladder without cholecystitis without obstruction: Secondary | ICD-10-CM | POA: Diagnosis not present

## 2020-02-12 DIAGNOSIS — Z03818 Encounter for observation for suspected exposure to other biological agents ruled out: Secondary | ICD-10-CM | POA: Diagnosis not present

## 2020-02-12 DIAGNOSIS — K8050 Calculus of bile duct without cholangitis or cholecystitis without obstruction: Secondary | ICD-10-CM | POA: Insufficient documentation

## 2020-02-12 DIAGNOSIS — R1084 Generalized abdominal pain: Secondary | ICD-10-CM

## 2020-02-12 DIAGNOSIS — Z87891 Personal history of nicotine dependence: Secondary | ICD-10-CM | POA: Insufficient documentation

## 2020-02-12 DIAGNOSIS — R7303 Prediabetes: Secondary | ICD-10-CM | POA: Diagnosis not present

## 2020-02-12 DIAGNOSIS — Z6841 Body Mass Index (BMI) 40.0 and over, adult: Secondary | ICD-10-CM | POA: Diagnosis not present

## 2020-02-12 DIAGNOSIS — R197 Diarrhea, unspecified: Secondary | ICD-10-CM | POA: Diagnosis not present

## 2020-02-12 DIAGNOSIS — R1011 Right upper quadrant pain: Secondary | ICD-10-CM | POA: Diagnosis not present

## 2020-02-12 DIAGNOSIS — I499 Cardiac arrhythmia, unspecified: Secondary | ICD-10-CM | POA: Diagnosis not present

## 2020-02-12 DIAGNOSIS — K8012 Calculus of gallbladder with acute and chronic cholecystitis without obstruction: Secondary | ICD-10-CM | POA: Diagnosis not present

## 2020-02-12 DIAGNOSIS — R1013 Epigastric pain: Secondary | ICD-10-CM | POA: Diagnosis not present

## 2020-02-12 DIAGNOSIS — Z20822 Contact with and (suspected) exposure to covid-19: Secondary | ICD-10-CM | POA: Diagnosis not present

## 2020-02-12 DIAGNOSIS — Z79899 Other long term (current) drug therapy: Secondary | ICD-10-CM | POA: Insufficient documentation

## 2020-02-12 DIAGNOSIS — Z7982 Long term (current) use of aspirin: Secondary | ICD-10-CM | POA: Insufficient documentation

## 2020-02-12 DIAGNOSIS — K838 Other specified diseases of biliary tract: Secondary | ICD-10-CM | POA: Diagnosis not present

## 2020-02-12 DIAGNOSIS — N390 Urinary tract infection, site not specified: Secondary | ICD-10-CM | POA: Diagnosis not present

## 2020-02-12 DIAGNOSIS — Z96652 Presence of left artificial knee joint: Secondary | ICD-10-CM | POA: Insufficient documentation

## 2020-02-12 DIAGNOSIS — R0789 Other chest pain: Secondary | ICD-10-CM | POA: Diagnosis not present

## 2020-02-12 DIAGNOSIS — R079 Chest pain, unspecified: Secondary | ICD-10-CM | POA: Diagnosis not present

## 2020-02-12 DIAGNOSIS — Z85528 Personal history of other malignant neoplasm of kidney: Secondary | ICD-10-CM | POA: Insufficient documentation

## 2020-02-12 LAB — COMPREHENSIVE METABOLIC PANEL
ALT: 651 U/L — ABNORMAL HIGH (ref 0–44)
AST: 589 U/L — ABNORMAL HIGH (ref 15–41)
Albumin: 3.4 g/dL — ABNORMAL LOW (ref 3.5–5.0)
Alkaline Phosphatase: 157 U/L — ABNORMAL HIGH (ref 38–126)
Anion gap: 9 (ref 5–15)
BUN: 11 mg/dL (ref 8–23)
CO2: 27 mmol/L (ref 22–32)
Calcium: 8.7 mg/dL — ABNORMAL LOW (ref 8.9–10.3)
Chloride: 102 mmol/L (ref 98–111)
Creatinine, Ser: 0.81 mg/dL (ref 0.44–1.00)
GFR calc Af Amer: >60 mL/min (ref >60)
GFR calc non Af Amer: >60 mL/min (ref >60)
Glucose, Bld: 137 mg/dL — ABNORMAL HIGH (ref 70–99)
Potassium: 3.7 mmol/L (ref 3.5–5.1)
Sodium: 138 mmol/L (ref 135–145)
Total Bilirubin: 4.2 mg/dL — ABNORMAL HIGH (ref 0.3–1.2)
Total Protein: 7.4 g/dL (ref 6.5–8.1)

## 2020-02-12 LAB — CBC
HCT: 46.2 % — ABNORMAL HIGH (ref 36.0–46.0)
Hemoglobin: 15 g/dL (ref 12.0–15.0)
MCH: 29.5 pg (ref 26.0–34.0)
MCHC: 32.5 g/dL (ref 30.0–36.0)
MCV: 90.9 fL (ref 80.0–100.0)
Platelets: 308 10*3/uL (ref 150–400)
RBC: 5.08 MIL/uL (ref 3.87–5.11)
RDW: 13.2 % (ref 11.5–15.5)
WBC: 8.1 10*3/uL (ref 4.0–10.5)
nRBC: 0 % (ref 0.0–0.2)

## 2020-02-12 LAB — TROPONIN I (HIGH SENSITIVITY): Troponin I (High Sensitivity): 2 ng/L (ref <18)

## 2020-02-12 LAB — LIPASE, BLOOD: Lipase: 15 U/L (ref 11–51)

## 2020-02-12 MED ORDER — HYDROMORPHONE HCL 1 MG/ML IJ SOLN
1.0000 mg | Freq: Once | INTRAMUSCULAR | Status: AC
  Administered 2020-02-12: 1 mg via INTRAVENOUS
  Filled 2020-02-12: qty 1

## 2020-02-12 MED ORDER — SODIUM CHLORIDE 0.9 % IV SOLN: INTRAVENOUS | Status: DC

## 2020-02-12 MED ORDER — ONDANSETRON HCL 4 MG/2ML IJ SOLN
4.0000 mg | Freq: Once | INTRAMUSCULAR | Status: AC
  Administered 2020-02-12: 4 mg via INTRAVENOUS
  Filled 2020-02-12: qty 2

## 2020-02-12 NOTE — ED Triage Notes (Signed)
Pt c/o epigastric pain x 3 weeks. PT c/o rib pain on both sides, but mostly on the right. Pt reports pain through to back, pain with breathing.

## 2020-02-13 ENCOUNTER — Emergency Department (HOSPITAL_COMMUNITY): Payer: Medicare Other

## 2020-02-13 DIAGNOSIS — K8020 Calculus of gallbladder without cholecystitis without obstruction: Secondary | ICD-10-CM | POA: Diagnosis not present

## 2020-02-13 MED ORDER — HYDROCODONE-ACETAMINOPHEN 5-325 MG PO TABS: 2.0000 | ORAL_TABLET | ORAL | 0 refills | Status: DC | PRN

## 2020-02-13 MED ORDER — IOHEXOL 300 MG/ML  SOLN
100.0000 mL | Freq: Once | INTRAMUSCULAR | Status: AC | PRN
  Administered 2020-02-13: 100 mL via INTRAVENOUS

## 2020-02-13 MED ORDER — ONDANSETRON 4 MG PO TBDP: 4.0000 mg | ORAL_TABLET | Freq: Three times a day (TID) | ORAL | 1 refills | Status: DC | PRN

## 2020-02-13 NOTE — ED Provider Notes (Addendum)
[facility] EMERGENCY DEPARTMENT Provider Note   CSN: [id number] Arrival date & time: 02/12/20  2047     History Chief Complaint  Patient presents with  . Abdominal Pain    Carol Doyle is a 79 y.o. female.  Patient with a complaint of abdominal pain more so in the epigastric area but kind of all over for 2 weeks.  Is associated with some cough.  Some nausea and vomiting.  The pain only lasts for about 2 to 3 minutes pretty intense and does go to the back area.  Oxygen saturations are 95% on room air.  Patient denies any lower extremity edema.  Not necessarily made worse with breathing.  Patient also with a complaint of dysuria that been going on for the last 2 days.  Past medical history is significant for prediabetes history of kidney cancer.  Patient surgical history significant for a right nephrectomy in 2008 and abdominal hysterectomy in 1971 and laparoscopic gastric sleeve resection in 2008        Past Medical History:  Diagnosis Date  . Anemia    blood transfusion "many years ago before 1970"  . Anxiety   . Arthritis   . Cancer [facility])    kidney cancer  . History of hiatal hernia 2009   incisional hernia  . Pneumonia    years ago, possibly age 14  . Pre-diabetes     Patient Active Problem List   Diagnosis Date Noted  . Chest pain 07/10/2019  . Obesity 07/10/2019  . 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 ABDOMINAL HYSTERECTOMY     1971  . COLONOSCOPY W/ POLYPECTOMY  2017  . LAPAROSCOPIC GASTRIC SLEEVE RESECTION  2008  . NEPHRECTOMY Right 2008  . TONSILLECTOMY     childhood  . 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 TRIGGER FINGER RELEASE       OB History   No obstetric history on file.     No family history on file.  Social History   Tobacco Use  .  Smoking status: Former Smoker    Types: Cigarettes  . Smokeless tobacco: Never Used  . Tobacco comment: 36 years   Substance Use Topics  . Alcohol use: No  . Drug use: No    Home Medications Prior to Admission medications   Medication Sig Start Date End Date Taking? Authorizing Provider  aspirin EC 81 MG EC tablet Take 1 tablet (81 mg total) by mouth daily. 07/12/19  Yes Elgergawy, Silver Huguenin, MD  atorvastatin (LIPITOR) 10 MG tablet Take 10 mg by mouth daily. 01/11/20  Yes [provider]  budesonide-formoterol (SYMBICORT) 160-4.5 MCG/ACT inhaler Inhale 2 puffs into the lungs 2 (two) times daily. 11/09/19  Yes [provider]  omeprazole (PRILOSEC) 40 MG capsule Take 40 mg by mouth daily. 02/11/20  Yes [provider]    Allergies    Patient has no known allergies.  Review of Systems   Review of Systems  Constitutional: Negative for chills and fever.  HENT: Negative for congestion, rhinorrhea and sore throat.   Eyes: Negative for visual disturbance.  Respiratory: Negative for cough and shortness of breath.   Cardiovascular: Negative for chest pain and leg swelling.  Gastrointestinal: Positive for abdominal pain, nausea and vomiting. Negative for diarrhea.  Genitourinary: Positive for dysuria.  Musculoskeletal:  Negative for back pain and neck pain.  Skin: Negative for rash.  Neurological: Negative for dizziness, light-headedness and headaches.  Hematological: Does not bruise/bleed easily.  Psychiatric/Behavioral: Negative for confusion.    Physical Exam Updated Vital Signs BP 139/86   Pulse 75   Ht 1.702 m (5\' 7" )   Wt 71.2 kg   SpO2 99%   BMI 24.59 kg/m   Physical Exam Vitals and nursing note reviewed.  Constitutional:      General: She is not in acute distress.    Appearance: Normal appearance. She is well-developed.  HENT:     Head: Normocephalic and atraumatic.  Eyes:     Extraocular Movements: Extraocular movements intact.      Conjunctiva/sclera: Conjunctivae normal.     Pupils: Pupils are equal, round, and reactive to light.  Cardiovascular:     Rate and Rhythm: Normal rate and regular rhythm.     Heart sounds: No murmur.  Pulmonary:     Effort: Pulmonary effort is normal. No respiratory distress.     Breath sounds: Normal breath sounds.  Abdominal:     Palpations: Abdomen is soft.     Tenderness: There is no abdominal tenderness.  Musculoskeletal:        General: Normal range of motion.     Cervical back: Normal range of motion and neck supple.  Skin:    General: Skin is warm and dry.  Neurological:     General: No focal deficit present.     Mental Status: She is alert and oriented to person, place, and time.     ED Results / Procedures / Treatments   Labs (all labs ordered are listed, but only abnormal results are displayed) Labs Reviewed  COMPREHENSIVE METABOLIC PANEL - Abnormal; Notable for the following components:      Result Value   Glucose, Bld 137 (*)    Calcium 8.7 (*)    Albumin 3.4 (*)    AST 589 (*)    ALT 651 (*)    Alkaline Phosphatase 157 (*)    Total Bilirubin 4.2 (*)    All other components within normal limits  CBC - Abnormal; Notable for the following components:   HCT 46.2 (*)    All other components within normal limits  LIPASE, BLOOD  URINALYSIS, ROUTINE W REFLEX MICROSCOPIC  TROPONIN I (HIGH SENSITIVITY)  TROPONIN I (HIGH SENSITIVITY)   Results for orders placed or performed during the hospital encounter of 02/12/20  Lipase, blood  Result Value Ref Range   Lipase 15 11 - 51 U/L  Comprehensive metabolic panel  Result Value Ref Range   Sodium 138 135 - 145 mmol/L   Potassium 3.7 3.5 - 5.1 mmol/L   Chloride 102 98 - 111 mmol/L   CO2 27 22 - 32 mmol/L   Glucose, Bld 137 (H) 70 - 99 mg/dL   BUN 11 8 - 23 mg/dL   Creatinine, Ser 0.81 0.44 - 1.00 mg/dL   Calcium 8.7 (L) 8.9 - 10.3 mg/dL   Total Protein 7.4 6.5 - 8.1 g/dL   Albumin 3.4 (L) 3.5 - 5.0 g/dL   AST  589 (H) 15 - 41 U/L   ALT 651 (H) 0 - 44 U/L   Alkaline Phosphatase 157 (H) 38 - 126 U/L   Total Bilirubin 4.2 (H) 0.3 - 1.2 mg/dL   GFR calc non Af Amer >60 >60 mL/min   GFR calc Af Amer >60 >60 mL/min   Anion gap 9 5 - 15  CBC  Result Value Ref Range   WBC 8.1 4.0 - 10.5 K/uL   RBC 5.08 3.87 - 5.11 MIL/uL   Hemoglobin 15.0 12.0 - 15.0 g/dL   HCT 46.2 (H) 36.0 - 46.0 %   MCV 90.9 80.0 - 100.0 fL   MCH 29.5 26.0 - 34.0 pg   MCHC 32.5 30.0 - 36.0 g/dL   RDW 13.2 11.5 - 15.5 %   Platelets 308 150 - 400 K/uL   nRBC 0.0 0.0 - 0.2 %  Troponin I (High Sensitivity)  Result Value Ref Range   Troponin I (High Sensitivity) 2 <18 ng/L     EKG EKG Interpretation  Date/Time:  Saturday February 12 2020 21:22:19 EST Ventricular Rate:  73 PR Interval:    QRS Duration: 100 QT Interval:  399 QTC Calculation: 440 R Axis:   -36 Text Interpretation: Ectopic atrial rhythm Ventricular premature complex Left axis deviation Low voltage, precordial leads Borderline T abnormalities, anterior leads No significant change since last tracing Confirmed by Fredia Sorrow [phone]) on 02/13/2020 12:21:10 AM   Radiology DG Chest 2 View  Result Date: 02/12/2020 CLINICAL DATA:  Denice Paradise the gastric pain EXAM: CHEST - 2 VIEW COMPARISON:  07/10/2019 FINDINGS: The heart size and mediastinal contours are within normal limits. Both lungs are clear. The visualized skeletal structures are unremarkable. IMPRESSION: No active cardiopulmonary disease. Electronically Signed   By: Constance Holster M.D.   On: 02/12/2020 22:56    Procedures Procedures (including critical care time)  Medications Ordered in ED Medications  0.9 %  sodium chloride infusion ( Intravenous New Bag/Given 02/12/20 2245)  ondansetron (ZOFRAN) injection 4 mg (4 mg Intravenous Given 02/12/20 2244)  HYDROmorphone (DILAUDID) injection 1 mg (1 mg Intravenous Given 02/12/20 2244)  iohexol (OMNIPAQUE) 300 MG/ML solution 100 mL (100 mLs Intravenous Contrast Given  02/13/20 0024)    ED Course  I have reviewed the triage vital signs and the nursing notes.  Pertinent labs & imaging results that were available during my care of the patient were reviewed by me and considered in my medical decision making (see chart for details).    MDM Rules/Calculators/A&P                        Patient's pain treated here with hydromorphone and Zofran.  Patient's chest x-ray negative troponin negative.  Labs without any significant abnormalities.  No leukocytosis.  Urinalysis still pending.  However patient's liver function test have liver enzymes in the 100 range.  Bilirubin is elevated.  And alkaline phosphatase is elevated.  So CT scan of abdomen which is pending will be important.  Does not seem to be any acute cardiac event or any evidence of any pulmonary abnormality on chest x-ray.  Discussed with Dr Gala Romney he feels that patient can be discharged home with precautions get Dr. Olevia Perches office a call on Monday.  He is currently the only gastroenterologist that does ERCP.  I will send her home with some pain medication.  Patient will return for any fevers or persistent vomiting.  Also put her on sort of a clear liquid diet nonfatty diet.  Final Clinical Impression(s) / ED Diagnoses Final diagnoses:  Generalized abdominal pain    Rx / DC Orders ED Discharge Orders    None       Fredia Sorrow, MD 02/13/20 [id number]    Fredia Sorrow, MD 02/13/20 0126   At this time patient's abdomen is completely nontender.  Pain is resolved.  Patient without any leukocytosis.  No evidence of acute cholecystitis on CT scan.     Fredia Sorrow, MD 02/13/20 [phone]

## 2020-02-13 NOTE — Discharge Instructions (Signed)
Take the pain medicine as directed and as needed.  Take the antinausea medicine as needed.  Return for fevers or persistent vomiting.  Give Dr. Rosina Lowenstein office a call on Monday he will need a specialized test to remove the gallstones from your common bile duct.

## 2020-02-14 DIAGNOSIS — K8050 Calculus of bile duct without cholangitis or cholecystitis without obstruction: Secondary | ICD-10-CM | POA: Diagnosis not present

## 2020-02-14 DIAGNOSIS — K219 Gastro-esophageal reflux disease without esophagitis: Secondary | ICD-10-CM | POA: Diagnosis not present

## 2020-02-14 DIAGNOSIS — R350 Frequency of micturition: Secondary | ICD-10-CM | POA: Diagnosis not present

## 2020-02-14 DIAGNOSIS — N390 Urinary tract infection, site not specified: Secondary | ICD-10-CM | POA: Diagnosis not present

## 2020-02-14 DIAGNOSIS — Z299 Encounter for prophylactic measures, unspecified: Secondary | ICD-10-CM | POA: Diagnosis not present

## 2020-02-14 MED FILL — Hydrocodone-Acetaminophen Tab 5-325 MG: ORAL | Qty: 6 | Status: AC

## 2020-02-15 ENCOUNTER — Inpatient Hospital Stay (HOSPITAL_COMMUNITY)
Admission: EM | Admit: 2020-02-15 | Discharge: 2020-02-17 | DRG: 418 | Disposition: A | Discharge status: Home / Self Care | Payer: Medicare Other | Attending: General Surgery | Admitting: General Surgery

## 2020-02-15 ENCOUNTER — Other Ambulatory Visit: Payer: Self-pay

## 2020-02-15 ENCOUNTER — Encounter (HOSPITAL_COMMUNITY): Payer: Self-pay | Admitting: *Deleted

## 2020-02-15 ENCOUNTER — Emergency Department (HOSPITAL_COMMUNITY): Payer: Medicare Other

## 2020-02-15 DIAGNOSIS — Z96652 Presence of left artificial knee joint: Secondary | ICD-10-CM | POA: Diagnosis present

## 2020-02-15 DIAGNOSIS — K8050 Calculus of bile duct without cholangitis or cholecystitis without obstruction: Secondary | ICD-10-CM

## 2020-02-15 DIAGNOSIS — R109 Unspecified abdominal pain: Secondary | ICD-10-CM | POA: Diagnosis not present

## 2020-02-15 DIAGNOSIS — R52 Pain, unspecified: Secondary | ICD-10-CM

## 2020-02-15 DIAGNOSIS — E669 Obesity, unspecified: Secondary | ICD-10-CM | POA: Diagnosis present

## 2020-02-15 DIAGNOSIS — K661 Hemoperitoneum: Secondary | ICD-10-CM

## 2020-02-15 DIAGNOSIS — R935 Abnormal findings on diagnostic imaging of other abdominal regions, including retroperitoneum: Secondary | ICD-10-CM | POA: Diagnosis not present

## 2020-02-15 DIAGNOSIS — K821 Hydrops of gallbladder: Secondary | ICD-10-CM | POA: Diagnosis not present

## 2020-02-15 DIAGNOSIS — Z7982 Long term (current) use of aspirin: Secondary | ICD-10-CM | POA: Diagnosis not present

## 2020-02-15 DIAGNOSIS — Z7951 Long term (current) use of inhaled steroids: Secondary | ICD-10-CM | POA: Diagnosis not present

## 2020-02-15 DIAGNOSIS — N390 Urinary tract infection, site not specified: Secondary | ICD-10-CM | POA: Diagnosis present

## 2020-02-15 DIAGNOSIS — Z87891 Personal history of nicotine dependence: Secondary | ICD-10-CM | POA: Diagnosis not present

## 2020-02-15 DIAGNOSIS — Z20822 Contact with and (suspected) exposure to covid-19: Secondary | ICD-10-CM | POA: Diagnosis present

## 2020-02-15 DIAGNOSIS — Z6841 Body Mass Index (BMI) 40.0 and over, adult: Secondary | ICD-10-CM

## 2020-02-15 DIAGNOSIS — R748 Abnormal levels of other serum enzymes: Secondary | ICD-10-CM

## 2020-02-15 DIAGNOSIS — K8021 Calculus of gallbladder without cholecystitis with obstruction: Secondary | ICD-10-CM

## 2020-02-15 DIAGNOSIS — Z85528 Personal history of other malignant neoplasm of kidney: Secondary | ICD-10-CM | POA: Diagnosis not present

## 2020-02-15 DIAGNOSIS — K810 Acute cholecystitis: Secondary | ICD-10-CM | POA: Diagnosis not present

## 2020-02-15 DIAGNOSIS — K449 Diaphragmatic hernia without obstruction or gangrene: Secondary | ICD-10-CM | POA: Diagnosis present

## 2020-02-15 DIAGNOSIS — K8020 Calculus of gallbladder without cholecystitis without obstruction: Secondary | ICD-10-CM | POA: Diagnosis not present

## 2020-02-15 DIAGNOSIS — K8012 Calculus of gallbladder with acute and chronic cholecystitis without obstruction: Secondary | ICD-10-CM | POA: Diagnosis present

## 2020-02-15 DIAGNOSIS — R7303 Prediabetes: Secondary | ICD-10-CM

## 2020-02-15 DIAGNOSIS — K8000 Calculus of gallbladder with acute cholecystitis without obstruction: Secondary | ICD-10-CM | POA: Diagnosis not present

## 2020-02-15 DIAGNOSIS — R1011 Right upper quadrant pain: Secondary | ICD-10-CM | POA: Diagnosis not present

## 2020-02-15 DIAGNOSIS — B9620 Unspecified Escherichia coli [E. coli] as the cause of diseases classified elsewhere: Secondary | ICD-10-CM | POA: Diagnosis present

## 2020-02-15 DIAGNOSIS — Z03818 Encounter for observation for suspected exposure to other biological agents ruled out: Secondary | ICD-10-CM | POA: Diagnosis not present

## 2020-02-15 DIAGNOSIS — R7989 Other specified abnormal findings of blood chemistry: Secondary | ICD-10-CM | POA: Diagnosis not present

## 2020-02-15 DIAGNOSIS — K8063 Calculus of gallbladder and bile duct with acute cholecystitis with obstruction: Secondary | ICD-10-CM | POA: Diagnosis not present

## 2020-02-15 DIAGNOSIS — K838 Other specified diseases of biliary tract: Secondary | ICD-10-CM | POA: Diagnosis not present

## 2020-02-15 LAB — COMPREHENSIVE METABOLIC PANEL
ALT: 492 U/L — ABNORMAL HIGH (ref 0–44)
AST: 292 U/L — ABNORMAL HIGH (ref 15–41)
Albumin: 3.4 g/dL — ABNORMAL LOW (ref 3.5–5.0)
Alkaline Phosphatase: 187 U/L — ABNORMAL HIGH (ref 38–126)
Anion gap: 11 (ref 5–15)
BUN: 10 mg/dL (ref 8–23)
CO2: 24 mmol/L (ref 22–32)
Calcium: 8.7 mg/dL — ABNORMAL LOW (ref 8.9–10.3)
Chloride: 100 mmol/L (ref 98–111)
Creatinine, Ser: 0.85 mg/dL (ref 0.44–1.00)
GFR calc Af Amer: >60 mL/min (ref >60)
GFR calc non Af Amer: >60 mL/min (ref >60)
Glucose, Bld: 132 mg/dL — ABNORMAL HIGH (ref 70–99)
Potassium: 3.8 mmol/L (ref 3.5–5.1)
Sodium: 135 mmol/L (ref 135–145)
Total Bilirubin: 4.1 mg/dL — ABNORMAL HIGH (ref 0.3–1.2)
Total Protein: 7.5 g/dL (ref 6.5–8.1)

## 2020-02-15 LAB — URINALYSIS, ROUTINE W REFLEX MICROSCOPIC
Glucose, UA: NEGATIVE mg/dL
Hgb urine dipstick: NEGATIVE
Ketones, ur: 5 mg/dL — AB
Nitrite: POSITIVE — AB
Protein, ur: 30 mg/dL — AB
Specific Gravity, Urine: 1.026 (ref 1.005–1.030)
WBC, UA: >50 WBC/hpf — ABNORMAL HIGH (ref 0–5)
pH: 5 (ref 5.0–8.0)

## 2020-02-15 LAB — RESPIRATORY PANEL BY RT PCR (FLU A&B, COVID)
Influenza A by PCR: NEGATIVE
Influenza B by PCR: NEGATIVE
SARS Coronavirus 2 by RT PCR: NEGATIVE

## 2020-02-15 LAB — CBC
HCT: 46.8 % — ABNORMAL HIGH (ref 36.0–46.0)
Hemoglobin: 14.9 g/dL (ref 12.0–15.0)
MCH: 29.3 pg (ref 26.0–34.0)
MCHC: 31.8 g/dL (ref 30.0–36.0)
MCV: 92.1 fL (ref 80.0–100.0)
Platelets: 323 10*3/uL (ref 150–400)
RBC: 5.08 MIL/uL (ref 3.87–5.11)
RDW: 13.4 % (ref 11.5–15.5)
WBC: 6.2 10*3/uL (ref 4.0–10.5)
nRBC: 0 % (ref 0.0–0.2)

## 2020-02-15 LAB — LIPASE, BLOOD: Lipase: 58 U/L — ABNORMAL HIGH (ref 11–51)

## 2020-02-15 MED ORDER — SODIUM CHLORIDE 0.9 % IV SOLN
2.0000 g | INTRAVENOUS | Status: DC
  Administered 2020-02-15 – 2020-02-16 (×2): 2 g via INTRAVENOUS
  Filled 2020-02-15 (×2): qty 20

## 2020-02-15 MED ORDER — ONDANSETRON HCL 4 MG PO TABS: 4.0000 mg | ORAL_TABLET | Freq: Four times a day (QID) | ORAL | Status: DC | PRN

## 2020-02-15 MED ORDER — MORPHINE SULFATE (PF) 2 MG/ML IV SOLN: 2.0000 mg | INTRAVENOUS | Status: DC | PRN

## 2020-02-15 MED ORDER — POLYETHYLENE GLYCOL 3350 17 G PO PACK: 17.0000 g | PACK | Freq: Every day | ORAL | Status: DC | PRN

## 2020-02-15 MED ORDER — ONDANSETRON HCL 4 MG/2ML IJ SOLN
4.0000 mg | Freq: Four times a day (QID) | INTRAMUSCULAR | Status: DC | PRN
  Administered 2020-02-16 – 2020-02-17 (×4): 4 mg via INTRAVENOUS
  Filled 2020-02-15 (×3): qty 2

## 2020-02-15 MED ORDER — MOMETASONE FURO-FORMOTEROL FUM 200-5 MCG/ACT IN AERO
2.0000 | INHALATION_SPRAY | Freq: Two times a day (BID) | RESPIRATORY_TRACT | Status: DC
  Administered 2020-02-15 – 2020-02-17 (×4): 2 via RESPIRATORY_TRACT
  Filled 2020-02-15: qty 8.8

## 2020-02-15 MED ORDER — ACETAMINOPHEN 650 MG RE SUPP: 650.0000 mg | Freq: Four times a day (QID) | RECTAL | Status: DC | PRN

## 2020-02-15 MED ORDER — POTASSIUM CHLORIDE IN NACL 20-0.9 MEQ/L-% IV SOLN: INTRAVENOUS | Status: AC

## 2020-02-15 MED ORDER — DIPHENHYDRAMINE HCL 25 MG PO CAPS
50.0000 mg | ORAL_CAPSULE | Freq: Every evening | ORAL | Status: DC | PRN
  Administered 2020-02-15 – 2020-02-16 (×2): 50 mg via ORAL
  Filled 2020-02-15 (×2): qty 2

## 2020-02-15 MED ORDER — ACETAMINOPHEN 325 MG PO TABS: 650.0000 mg | ORAL_TABLET | Freq: Four times a day (QID) | ORAL | Status: DC | PRN

## 2020-02-15 NOTE — ED Provider Notes (Signed)
Emergency Department Provider Note   I have reviewed the triage vital signs and the nursing notes.   HISTORY  Chief Complaint Abdominal Pain   HPI Carol Doyle is a 79 y.o. female with PMH reviewed below returns to the ED for evaluation of continued epigastric abdominal pain with nausea vomiting.  Patient was seen in the emergency department on 3/7 and found to have likely symptomatic cholelithiasis with questionable choledocholithiasis.  Her pain was controlled over the weekend and gastroenterology was consulted during her last ED visit.  She was advised by the GI office to return today for likely ERCP. she continues to have pain and vomiting today.  She denies fever.  She has only had fluids today.  No diarrhea.  No radiation of symptoms or other modifying factors.   Past Medical History:  Diagnosis Date  . Anemia    blood transfusion "many years ago before 1970"  . Anxiety   . Arthritis   . Cancer [facility])    kidney cancer  . History of hiatal hernia 2009   incisional hernia  . Pneumonia    years ago, possibly age 27  . Pre-diabetes     Patient Active Problem List   Diagnosis Date Noted  . Acute cholecystitis 02/16/2020  . Abdominal pain 02/15/2020  . Cholelithiasis 02/15/2020  . Prediabetes 02/15/2020  . Elevated liver enzymes 02/15/2020  . Choledocholithiasis 02/15/2020  . Cholelithiases 02/15/2020  . Chest pain 07/10/2019  . Obesity 07/10/2019  . 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 ABDOMINAL HYSTERECTOMY     1971  . CHOLECYSTECTOMY N/A 02/16/2020   Procedure: LAPAROSCOPIC CHOLECYSTECTOMY;  Surgeon: Virl Cagey, MD;  Location: [facility];  Service: General;  Laterality: N/A;  . COLONOSCOPY W/ POLYPECTOMY  2017  . LAPAROSCOPIC GASTRIC SLEEVE RESECTION  2008  . NEPHRECTOMY Right 2008  . TONSILLECTOMY     childhood  . TOTAL KNEE ARTHROPLASTY Left 01/06/2017   Procedure: LEFT TOTAL KNEE  ARTHROPLASTY;  Surgeon: Gaynelle Arabian, MD;  Location: [facility];  Service: Orthopedics;  Laterality: Left;  . TRIGGER FINGER RELEASE      Allergies Patient has no known allergies.  History reviewed. No pertinent family history.  Social History Social History   Tobacco Use  . Smoking status: Former Smoker    Types: Cigarettes  . Smokeless tobacco: Never Used  . Tobacco comment: 36 years   Substance Use Topics  . Alcohol use: No  . Drug use: No    Review of Systems  Constitutional: No fever/chills Eyes: No visual changes. ENT: No sore throat. Cardiovascular: Denies chest pain. Respiratory: Denies shortness of breath. Gastrointestinal: Epigastric abdominal pain.  Positive nausea and vomiting.  No diarrhea.  No constipation. Genitourinary: Negative for dysuria. Musculoskeletal: Negative for back pain. Skin: Negative for rash. Neurological: Negative for headaches, focal weakness or numbness.  10-point ROS otherwise negative.  ____________________________________________   PHYSICAL EXAM:  VITAL SIGNS: ED Triage Vitals  Enc Vitals Group     BP 02/15/20 1446 (!) 123/53     Pulse Rate 02/15/20 1446 78     Resp 02/15/20 1446 20     Temp 02/15/20 1446 98.9 F (37.2 C)     Temp src --      SpO2 02/15/20 1446 97 %   Constitutional: Alert and oriented. Well appearing and in no acute distress. Eyes: Conjunctivae are normal.  Head: Atraumatic. Nose: No congestion/rhinnorhea. Mouth/Throat: Mucous membranes are moist.  Neck: No stridor.  Cardiovascular: Normal rate, regular rhythm. Good peripheral circulation. Grossly normal heart sounds.   Respiratory: Normal respiratory effort.  No retractions. Lungs CTAB. Gastrointestinal: Soft with epigastric and RUQ tenderness. No distention.  Musculoskeletal: No gross deformities of extremities. Neurologic:  Normal speech and language. Skin:  Skin is warm, dry and intact. No rash  noted.  ____________________________________________   LABS (all labs ordered are listed, but only abnormal results are displayed)  Labs Reviewed  URINE CULTURE - Abnormal; Notable for the following components:      Result Value   Culture   (*)    Value: >=100,000 COLONIES/mL ESCHERICHIA COLI SUSCEPTIBILITIES TO FOLLOW Performed at [facility], [place] [address] [place], [place] [address]    All other components within normal limits  LIPASE, BLOOD - Abnormal; Notable for the following components:   Lipase 58 (*)    All other components within normal limits  COMPREHENSIVE METABOLIC PANEL - Abnormal; Notable for the following components:   Glucose, Bld 132 (*)    Calcium 8.7 (*)    Albumin 3.4 (*)    AST 292 (*)    ALT 492 (*)    Alkaline Phosphatase 187 (*)    Total Bilirubin 4.1 (*)    All other components within normal limits  CBC - Abnormal; Notable for the following components:   HCT 46.8 (*)    All other components within normal limits  URINALYSIS, ROUTINE W REFLEX MICROSCOPIC - Abnormal; Notable for the following components:   Color, Urine AMBER (*)    APPearance HAZY (*)    Bilirubin Urine MODERATE (*)    Ketones, ur 5 (*)    Protein, ur 30 (*)    Nitrite POSITIVE (*)    Leukocytes,Ua SMALL (*)    WBC, UA >50 (*)    Bacteria, UA RARE (*)    All other components within normal limits  HEPATIC FUNCTION PANEL - Abnormal; Notable for the following components:   Albumin 2.9 (*)    AST 188 (*)    ALT 354 (*)    Alkaline Phosphatase 158 (*)    Total Bilirubin 1.6 (*)    Bilirubin, Direct 0.7 (*)    All other components within normal limits  BASIC METABOLIC PANEL - Abnormal; Notable for the following components:   Glucose, Bld 107 (*)    Calcium 8.4 (*)    All other components within normal limits  HEMOGLOBIN A1C - Abnormal; Notable for the following components:   Hgb A1c MFr Bld 5.9 (*)    All other components within normal limits  COMPREHENSIVE METABOLIC  PANEL - Abnormal; Notable for the following components:   Glucose, Bld 133 (*)    Calcium 8.1 (*)    Total Protein 6.3 (*)    Albumin 2.8 (*)    AST 193 (*)    ALT 324 (*)    All other components within normal limits  CBC WITH DIFFERENTIAL/PLATELET - Abnormal; Notable for the following components:   WBC 12.4 (*)    Neutro Abs 10.1 (*)    All other components within normal limits  RESPIRATORY PANEL BY RT PCR (FLU A&B, COVID)  AMYLASE  LIPASE, BLOOD  PROTIME-INR  CBC  MAGNESIUM  SURGICAL PATHOLOGY   ____________________________________________  RADIOLOGY  MR ABDOMEN MRCP WO CONTRAST  Result Date: 02/17/2020 CLINICAL DATA:  Abdominal pain and elevated liver function tests. One day status post cholecystectomy. Evaluate for biliary obstruction and choledocholithiasis. EXAM: MRI ABDOMEN WITHOUT CONTRAST  (  INCLUDING MRCP) TECHNIQUE: Multiplanar multisequence MR imaging of the abdomen was performed. Heavily T2-weighted images of the biliary and pancreatic ducts were obtained, and three-dimensional MRCP images were rendered by post processing. COMPARISON:  CT on 02/13/2020 FINDINGS: Lower chest: Dependent bibasilar atelectasis. Hepatobiliary: No masses visualized on this unenhanced exam. Postop changes are seen from recent cholecystectomy. A surgical drain is seen in the right upper quadrant. Multiloculated fluid collection with surrounding edema is seen in the right subhepatic space and along the course of the drainage catheter. This collection measures approximately 8.3 x 4.4 cm on image 32/3. There is no evidence of biliary ductal dilatation, with common bile duct measuring 7 mm. No evidence of choledocholithiasis. Pancreas: No mass or inflammatory process visualized on this unenhanced exam. Spleen:  Within normal limits in size. 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 Normal appearance of left kidney. No evidence  hydronephrosis. Stomach/Bowel: Unremarkable. Vascular/Lymphatic: No pathologically enlarged lymph nodes identified. No evidence of abdominal aortic aneurysm. Other:  None. Musculoskeletal:  No suspicious bone lesions identified. IMPRESSION: 1. Postop changes from recent cholecystectomy. Multiloculated fluid collection with surrounding edema in the right subhepatic space, measuring 8.3 x 4.4 cm. This may represent a postoperative hemorrhage or biloma. Consider nuclear medicine hepatobiliary scan to exclude potential bile leak. 2. No evidence of biliary ductal dilatation. No evidence of choledocholithiasis. Electronically Signed   By: Marlaine Hind M.D.   On: 02/17/2020 10:59   MR 3D Recon At Scanner  Result Date: 02/17/2020 CLINICAL DATA:  Abdominal pain and elevated liver function tests. One day status post cholecystectomy. Evaluate for biliary obstruction and choledocholithiasis. EXAM: MRI ABDOMEN WITHOUT CONTRAST  (INCLUDING MRCP) TECHNIQUE: Multiplanar multisequence MR imaging of the abdomen was performed. Heavily T2-weighted images of the biliary and pancreatic ducts were obtained, and three-dimensional MRCP images were rendered by post processing. COMPARISON:  CT on 02/13/2020 FINDINGS: Lower chest: Dependent bibasilar atelectasis. Hepatobiliary: No masses visualized on this unenhanced exam. Postop changes are seen from recent cholecystectomy. A surgical drain is seen in the right upper quadrant. Multiloculated fluid collection with surrounding edema is seen in the right subhepatic space and along the course of the drainage catheter. This collection measures approximately 8.3 x 4.4 cm on image 32/3. There is no evidence of biliary ductal dilatation, with common bile duct measuring 7 mm. No evidence of choledocholithiasis. Pancreas: No mass or inflammatory process visualized on this unenhanced exam. Spleen:  Within normal limits in size. Adrenals/Urinary tract: Previous right nephrectomy with right lateral  abdominal wall hernia containing ascending colon. Normal appearance of left kidney. No evidence hydronephrosis. Stomach/Bowel: Unremarkable. Vascular/Lymphatic: No pathologically enlarged lymph nodes identified. No evidence of abdominal aortic aneurysm. Other:  None. Musculoskeletal:  No suspicious bone lesions identified. IMPRESSION: 1. Postop changes from recent cholecystectomy. Multiloculated fluid collection with surrounding edema in the right subhepatic space, measuring 8.3 x 4.4 cm. This may represent a postoperative hemorrhage or biloma. Consider nuclear medicine hepatobiliary scan to exclude potential bile leak. 2. No evidence of biliary ductal dilatation. No evidence of choledocholithiasis. Electronically Signed   By: Marlaine Hind M.D.   On: 02/17/2020 10:59    ____________________________________________   PROCEDURES  Procedure(s) performed:   Procedures  None ____________________________________________   INITIAL IMPRESSION / ASSESSMENT AND PLAN / ED COURSE  Pertinent labs & imaging results that were available during my care of the patient were reviewed by me and considered in my medical decision making (see chart for details).   Patient  returns to the emergency department with epigastric abdominal pain, nausea, vomiting.  Reviewed the prior CT scan.  Plan for right upper quadrant ultrasound here and repeat labs.  Will reach out to GI regarding ERCP if acute cholecystitis is not found.   04:50 PM  Spoke with Dr. Oneida Alar. Dr. Laural Golden does ERCP and can see the patient in the AM. Keep NPO and hold anticoagulation. She will pass the consult along.   Discussed patient's case with [facility] to request admission. Patient and family (if present) updated with plan. Care transferred to [facility] service.  I reviewed all nursing notes, vitals, pertinent old records, EKGs, labs, imaging (as available).  ____________________________________________  FINAL CLINICAL IMPRESSION(S) / ED DIAGNOSES  Final  diagnoses:  RUQ pain  Choledocholithiasis     MEDICATIONS GIVEN DURING THIS VISIT:  Medications  cefTRIAXone (ROCEPHIN) 2 g in sodium chloride 0.9 % 100 mL IVPB (2 g Intravenous New Bag/Given 02/16/20 2234)  mometasone-formoterol (DULERA) 200-5 MCG/ACT inhaler 2 puff (2 puffs Inhalation Given 02/17/20 0920)  0.9 % NaCl with KCl 20 mEq/ L  infusion ( Intravenous Transfusing/Transfer 02/16/20 1913)  acetaminophen (TYLENOL) tablet 650 mg ( Oral MAR Unhold 02/16/20 1736)    Or  acetaminophen (TYLENOL) suppository 650 mg ( Rectal MAR Unhold 02/16/20 1736)  ondansetron (ZOFRAN) tablet 4 mg ( Oral See Alternative 02/17/20 0922)    Or  ondansetron (ZOFRAN) injection 4 mg (4 mg Intravenous Given 02/17/20 0922)  polyethylene glycol (MIRALAX / GLYCOLAX) packet 17 g ( Oral MAR Unhold 02/16/20 1736)  diphenhydrAMINE (BENADRYL) capsule 50 mg (50 mg Oral Given 02/16/20 2319)  oxyCODONE (Oxy IR/ROXICODONE) immediate release tablet 5 mg (5 mg Oral Given 02/17/20 1126)  morphine 2 MG/ML injection 2 mg (2 mg Intravenous Given 02/17/20 0817)  0.9 % NaCl with KCl 20 mEq/ L  infusion ( Intravenous New Bag/Given 02/17/20 0129)  cefoTEtan (CEFOTAN) 2 g in sodium chloride 0.9 % 100 mL IVPB (2 g Intravenous New Bag/Given 02/16/20 1404)  pantoprazole (PROTONIX) injection 40 mg (40 mg Intravenous Given 02/17/20 0533)     NEW OUTPATIENT MEDICATIONS STARTED DURING THIS VISIT:  Discharge Medication List as of 02/17/2020  1:58 PM    START taking these medications   Details  docusate sodium (COLACE) 100 MG capsule Take 1 capsule (100 mg total) by mouth 2 (two) times daily as needed for mild constipation., Starting Thu 02/17/2020, Until Fri 02/16/2021, Normal    ondansetron (ZOFRAN) 4 MG tablet Take 1 tablet (4 mg total) by mouth every 6 (six) hours as needed for nausea., Starting Thu 02/17/2020, Normal    oxyCODONE (OXY IR/ROXICODONE) 5 MG immediate release tablet Take 1 tablet (5 mg total) by mouth every 4 (four) hours as  needed for severe pain or breakthrough pain., Starting Thu 02/17/2020, Normal        Note:  This document was prepared using Dragon voice recognition software and may include unintentional dictation errors.  Nanda Quinton, MD, [facility]    Jamar Weatherall, [facility], MD 02/17/20 [phone]

## 2020-02-15 NOTE — H&P (Signed)
History and Physical    Carol Doyle [id number] DOB: 1941-01-13 DOA: 02/15/2020  PCP: Monico Blitz, MD   Patient coming from: Home  I have personally briefly reviewed patient's old medical records in [place]  Chief Complaint: Abdominal pain, Vomiting  HPI: Carol Doyle is a 79 y.o. female with medical history significant for renal cancer, prediabetes.  Patient was in the ED 02/13/2020 with complaints of abdominal pain, also with nausea and vomiting.  Blood work showed elevated liver enzymes, abdominal CT was negative for acute cholecystitis, showed multiple small gallstones, prominence of biliary ductal tree. EDP talked to gastroenterologist, Dr. Gala Romney, with recommendations that patient could be discharged home to follow-up with Dr. Olevia Perches office.  With improvement in abdominal pain, patient was discharged with return precautions.  She was called to come to the ED today for further evaluation.  Patient tells me over the past 2 days, she has had persistent upper abdominal pain, with some vomiting.  She reports poor p.o. intake since discharge.  She also reports some pain with urination.  No fever no chills.  ED Course: Vitals.  Liver enzymes persistently elevated with some improvement in AST and ALT.  WBC 6.2.  Quadrant abdominal ultrasound shows cholelithiasis without evidence of acute cholecystitis.  Mild intrahepatic biliary duct dilatation.  Patient was evaluated by Dr. Laural Golden in the ED-suspects choledocholithiasis.  Recommends clear liquids, with n.p.o. at midnight, IV ceftriaxone, surgical consultation, ERCP prior to laparoscopic cholecystomy to me unless a significant drop in transaminases and bilirubin tomorrow morning.  Review of Systems: As per HPI all other systems reviewed and negative.  Past Medical History:  Diagnosis Date  . Anemia    blood transfusion "many years ago before 1970"  . Anxiety   . Arthritis   . Cancer [facility])    kidney cancer  . History  of hiatal hernia 2009   incisional hernia  . Pneumonia    years ago, possibly age 55  . Pre-diabetes     Past Surgical History:  Procedure Laterality Date  . ABDOMINAL HYSTERECTOMY     1971  . COLONOSCOPY W/ POLYPECTOMY  2017  . LAPAROSCOPIC GASTRIC SLEEVE RESECTION  2008  . NEPHRECTOMY Right 2008  . TONSILLECTOMY     childhood  . 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 TRIGGER FINGER RELEASE       reports that she has quit smoking. Her smoking use included cigarettes. She has never used smokeless tobacco. She reports that she does not drink alcohol or use drugs.  No Known Allergies  History reviewed. No pertinent family history.  Prior to Admission medications   Medication Sig Start Date End Date Taking? Authorizing Provider  aspirin 325 MG tablet Take 325 mg by mouth once.   Yes [provider]  atorvastatin (LIPITOR) 10 MG tablet Take 10 mg by mouth daily. 01/11/20  Yes [provider]  budesonide-formoterol (SYMBICORT) 160-4.5 MCG/ACT inhaler Inhale 1-2 puffs into the lungs daily as needed (for shortness of breath).  11/09/19  Yes [provider]  doxycycline (VIBRA-TABS) 100 MG tablet Take 100 mg by mouth 2 (two) times daily. 7 day course starting on 02/15/2020 02/14/20  Yes [provider]  HYDROcodone-acetaminophen (NORCO/VICODIN) 5-325 MG tablet Take 2 tablets by mouth every 4 (four) hours as needed. 02/13/20  Yes Fredia Sorrow, MD  omeprazole (PRILOSEC) 40 MG capsule Take 40  mg by mouth daily. 02/11/20  Yes [provider]  ondansetron (ZOFRAN ODT) 4 MG disintegrating tablet Take 1 tablet (4 mg total) by mouth every 8 (eight) hours as needed. Patient taking differently: Take 4 mg by mouth every 8 (eight) hours as needed for nausea or vomiting.  02/13/20  Yes Fredia Sorrow, MD  Turmeric 500 MG CAPS  Take 1,000 mg by mouth daily.   Yes [provider]    Physical Exam: Vitals:   02/15/20 1446  BP: (!) 123/53  Pulse: 78  Resp: 20  Temp: 98.9 F (37.2 C)  SpO2: 97%    Constitutional: NAD, calm, comfortable Vitals:   02/15/20 1446  BP: (!) 123/53  Pulse: 78  Resp: 20  Temp: 98.9 F (37.2 C)  SpO2: 97%   Eyes: PERRL, lids and conjunctivae normal ENMT: Mucous membranes are moist.  Neck: normal, supple, no masses, no thyromegaly Respiratory: clear to auscultation bilaterally, no wheezing, no crackles. Normal respiratory effort. No accessory muscle use.  Cardiovascular: Regular rate and rhythm, no murmurs / rubs / gallops. No extremity edema. 2+ pedal pulses.  Abdomen: no appreciable tenderness, no masses palpated. No hepatosplenomegaly.  Musculoskeletal: no clubbing / cyanosis. No joint deformity upper and lower extremities. Good ROM, no contractures. Normal muscle tone.  Skin: no rashes, lesions, ulcers. No induration Neurologic: No apparent cranial nerve abnormality, moving all extremities spontaneously. Psychiatric: Normal judgment and insight. Alert and oriented x 3. Normal mood.   Labs on Admission: I have personally reviewed following labs and imaging studies  CBC: Recent Labs  Lab 02/12/20 2248 02/15/20 1513  WBC 8.1 6.2  HGB 15.0 14.9  HCT 46.2* 46.8*  MCV 90.9 92.1  PLT 308 [id number]   Basic Metabolic Panel: Recent Labs  Lab 02/12/20 2248 02/15/20 1513  NA 138 135  K 3.7 3.8  CL 102 100  CO2 27 24  GLUCOSE 137* 132*  BUN 11 10  CREATININE 0.81 0.85  CALCIUM 8.7* 8.7*   Liver Function Tests: Recent Labs  Lab 02/12/20 2248 02/15/20 1513  AST 589* 292*  ALT 651* 492*  ALKPHOS 157* 187*  BILITOT 4.2* 4.1*  PROT 7.4 7.5  ALBUMIN 3.4* 3.4*   Recent Labs  Lab 02/12/20 2248 02/15/20 1513  LIPASE 15 58*   Radiological Exams on Admission: US Abdomen Limited RUQ  Result Date: 02/15/2020 CLINICAL DATA:  Right upper quadrant pain for 5  days EXAM: ULTRASOUND ABDOMEN LIMITED RIGHT UPPER QUADRANT COMPARISON:  02/13/2020. FINDINGS: Gallbladder: There are multiple small shadowing calculi layering dependently within the gallbladder, largest measuring 4 mm. There is no gallbladder wall thickening or pericholecystic fluid. Patient did display tenderness during performance of the ultrasound according to the sonographer. Common bile duct: Diameter: 7 mm Liver: Liver demonstrates normal echotexture. Mild intrahepatic biliary duct dilation. No focal liver abnormalities. Portal vein is patent on color Doppler imaging with normal direction of blood flow towards the liver. Other: None. IMPRESSION: 1. Cholelithiasis without evidence of acute cholecystitis. 2. Mild intrahepatic biliary duct dilation, though normal caliber is seen within the common bile duct. Choledocholithiasis was reported in the downstream common bile duct on prior CT, and the mild intrahepatic biliary duct dilation is unchanged since that exam. Electronically Signed   By: Randa Ngo M.D.   On: 02/15/2020 16:15    EKG: None.   Assessment/Plan Active Problems:   Abdominal pain   Cholelithiasis   Prediabetes   Elevated liver enzymes   Choledocholithiasis    Choledocholithiasis-abdominal pain, vomiting, persistently  elevated liver enzymes ALT 651 >> 492 today, AST 589 > 292, ALP 157 > 187, T bili 4.2 > 4.1. Mildly elevated lipase 58. abdominal CT and RUQ ultrasound shows cholelithiasis without acute cholecystitis.CT shows questionable stones in common bile duct at the region of the ampulla.  Ultrasound today shows mild intrahepatic biliary duct dilatation, normal caliber CBD. -Evaluated by gastroenterologist Dr. Laural Golden in ED, follow recommendation -Clear liquid diet, n.p.o. midnight -General surgery consult -IV ceftriaxone 2 g daily -Hepatic function panel, amylase, lipase, PT/INR in a.m. -CBC a.m - N/s + 20 KCL 75cc/hr x 15 hrs  Urinary tract infection-reports dysuria,  UA suggestive with positive nitrite, small leukocytes, greater than 50 WBCs.  Rules out for sepsis. -Follow-up urine culture -Continue IV ceftriaxone  Prediabetes-random glucose 195. -Clear liquid diet for now. - HgbA1c   DVT prophylaxis: SCDs  Code Status: Full code Family Communication: None at bedside Disposition Plan: Pending GI evaluation Consults called: Gastroenterology Admission status: Inpatient, MedSurg I certify that at the point of admission it is my clinical judgment that the patient will require inpatient hospital care spanning beyond 2 midnights from the point of admission due to high intensity of service, high risk for further deterioration and high frequency of surveillance required.   Bethena Roys MD [facility]  02/15/2020, 10:14 PM

## 2020-02-15 NOTE — ED Triage Notes (Signed)
States she was diagnosed with gallstones a couple of days ago, states she was advised by Dr Gentry Fitz office to come to the hospital for admission

## 2020-02-15 NOTE — Consult Note (Signed)
Referring Provider: Monico Blitz, MD Primary Care Physician:  Carol Blitz, MD Primary Gastroenterologist:  Dr. Laural Doyle  Reason for Consultation:    Recurrent upper abdominal pain elevated transaminases and bilirubin and ultrasound revealing multiple gallstones and prominent biliary and possible choledocholithiasis.  HPI:   Patient is 79 year old Caucasian female who has been experiencing epigastric pain since August last year when she was briefly hospitalized it was concluded that she was having pain due to GERD.  Patient was begun on a PPI which she has been taking on as-needed basis. She has been having intermittent epigastric pain associated nausea and vomiting for the last 3 weeks.  She states she was seen by Dr. Manuella Doyle on 3 different occasions and it was felt that she is having musculoskeletal pain.  She came to emergency room on evening of 02/12/2020 because she was having severe pain which she describes to be unbearable.  Evaluation in emergency room revealed elevated serum bilirubin and transaminases.  CT scan showed multiple small stones in the gallbladder prominent intrahepatic biliary system and possible common duct stones.  Patient was treated with antiemetics and narcotic.  Dr. Rogene Doyle talk with Dr. Gala Doyle who was on call.  Decision was made for patient to go home and call office on Monday.  She did call her office yesterday and she was scheduled to be seen tomorrow but she called her office earlier today stating that she was having another attack of severe pain.  She also reported not been able to eat or drink much over the last few days.  She states she is afraid to eat.  She states she had small piece of bacon last night and had pain for 9 hours.  He has noted change in color of urine. She also reports that her urine has sediment.  She is concerned if she has urinary tract infection.  Urine analysis is pending. She denies fever chills hematemesis melena or rectal bleeding.  She states she has  frequent diarrhea with urgency.  She has never been diagnosed with irritable bowel syndrome.  She states she is up-to-date on her colonoscopy schedule.  She second 1 in [place] last year with removal of couple of polyps.  She denies heartburn.  She states she has not been taking PPI on regular basis. She had ultrasound which reveals multiple layered gallstones prominent intrahepatic biliary radicles but no obvious stone in the bile duct. LFTs remain abnormal but AST and ALT are coming down. She denies chest pain or shortness of breath. 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  She she has had left knee replacement.  She states he was planning to have surgery on her right knee but since she has been taking turmeric her pain has decreased and she is now able to walk without any difficulty and decided not to have have knee replacement.  She is retired.  She is divorced.  She has 2 daughters ages 83 and 45. She works for [employer] in [place] but she is now retired. She smokes cigarettes for 38 years and quit 25 years ago.  She states she smoked up to 2 packs/day before she quit.  She does not drink alcohol.  Father was diagnosed with lung carcinoma at age 31 and died a year later. Mother had coronary artery disease and lived to be 81.  She lost her brother due to prostate carcinoma which was diagnosed at age 24 and he died at 31. She has 1 sister living who was  treated for breast carcinoma when she was 79 years old and she is now 37 and doing well. Maternal uncle died of pancreatic carcinoma in his late 27s.    Past Medical History:  Diagnosis Date  . Anemia    blood transfusion "many years ago before 1970"  . Anxiety   . Arthritis   . Cancer [facility])    kidney cancer  . History of hiatal hernia 2009   incisional hernia  . Pneumonia    years ago, possibly age 56  . Pre-diabetes     Past Surgical History:  Procedure Laterality Date  . ABDOMINAL HYSTERECTOMY      1971  . COLONOSCOPY W/ POLYPECTOMY  2017  . LAPAROSCOPIC GASTRIC SLEEVE RESECTION she also had antireflux procedure at the time of gastric sleeve resection.  2008  . NEPHRECTOMY Right 2008  . TONSILLECTOMY     childhood  . TOTAL KNEE ARTHROPLASTY Left 01/06/2017   Procedure: LEFT TOTAL KNEE ARTHROPLASTY;  Surgeon: Carol Arabian, MD;  Location: [facility];  Service: Orthopedics;  Laterality: Left;  . TRIGGER FINGER RELEASE      Prior to Admission medications   Medication Sig Start Date End Date Taking? Authorizing Provider  aspirin 325 MG tablet Take 325 mg by mouth once.   Yes [provider]  atorvastatin (LIPITOR) 10 MG tablet Take 10 mg by mouth daily. 01/11/20  Yes [provider]  budesonide-formoterol (SYMBICORT) 160-4.5 MCG/ACT inhaler Inhale 1-2 puffs into the lungs daily as needed (for shortness of breath).  11/09/19  Yes [provider]  doxycycline (VIBRA-TABS) 100 MG tablet Take 100 mg by mouth 2 (two) times daily. 7 day course starting on 02/15/2020 02/14/20  Yes [provider]  HYDROcodone-acetaminophen (NORCO/VICODIN) 5-325 MG tablet Take 2 tablets by mouth every 4 (four) hours as needed. 02/13/20  Yes Carol Sorrow, MD  omeprazole (PRILOSEC) 40 MG capsule Take 40 mg by mouth daily. 02/11/20  Yes [provider]  ondansetron (ZOFRAN ODT) 4 MG disintegrating tablet Take 1 tablet (4 mg total) by mouth every 8 (eight) hours as needed. Patient taking differently: Take 4 mg by mouth every 8 (eight) hours as needed for nausea or vomiting.  02/13/20  Yes Carol Sorrow, MD  Turmeric 500 MG CAPS Take 1,000 mg by mouth daily.   Yes [provider]    No current facility-administered medications for this encounter.   Current Outpatient Medications  Medication Sig Dispense Refill  . aspirin 325 MG tablet Take 325 mg by mouth once.    Marland Kitchen atorvastatin (LIPITOR) 10 MG tablet Take 10 mg by mouth daily.    . budesonide-formoterol (SYMBICORT)  160-4.5 MCG/ACT inhaler Inhale 1-2 puffs into the lungs daily as needed (for shortness of breath).     . doxycycline (VIBRA-TABS) 100 MG tablet Take 100 mg by mouth 2 (two) times daily. 7 day course starting on 02/15/2020    . HYDROcodone-acetaminophen (NORCO/VICODIN) 5-325 MG tablet Take 2 tablets by mouth every 4 (four) hours as needed. 6 tablet 0  . omeprazole (PRILOSEC) 40 MG capsule Take 40 mg by mouth daily.    . ondansetron (ZOFRAN ODT) 4 MG disintegrating tablet Take 1 tablet (4 mg total) by mouth every 8 (eight) hours as needed. (Patient taking differently: Take 4 mg by mouth every 8 (eight) hours as needed for nausea or vomiting. ) 10 tablet 1  . Turmeric 500 MG CAPS Take 1,000 mg by mouth daily.      Allergies as of 02/15/2020  . (  No Known Allergies)    History reviewed. No pertinent family history.  Social History   Socioeconomic History  . Marital status: Single    Spouse name: Not on file  . Number of children: Not on file  . Years of education: Not on file  . Highest education level: Not on file  Occupational History  . Not on file  Tobacco Use  . Smoking status: Former Smoker    Types: Cigarettes  . Smokeless tobacco: Never Used  . Tobacco comment: 36 years   Substance and Sexual Activity  . Alcohol use: No  . Drug use: No  . Sexual activity: Not on file  Other Topics Concern  . Not on file  Social History Narrative  . Not on file   Social Determinants of Health   Financial Resource Strain:   . Difficulty of Paying Living Expenses: Not on file  Food Insecurity:   . Worried About Charity fundraiser in the Last Year: Not on file  . Ran Out of Food in the Last Year: Not on file  Transportation Needs:   . Lack of Transportation (Medical): Not on file  . Lack of Transportation (Non-Medical): Not on file  Physical Activity:   . Days of Exercise per Week: Not on file  . Minutes of Exercise per Session: Not on file  Stress:   . Feeling of Stress : Not on  file  Social Connections:   . Frequency of Communication with Friends and Family: Not on file  . Frequency of Social Gatherings with Friends and Family: Not on file  . Attends Religious Services: Not on file  . Active Member of Clubs or Organizations: Not on file  . Attends Archivist Meetings: Not on file  . Marital Status: Not on file  Intimate Partner Violence:   . Fear of Current or Ex-Partner: Not on file  . Emotionally Abused: Not on file  . Physically Abused: Not on file  . Sexually Abused: Not on file    Review of Systems: See HPI, otherwise normal ROS  Physical Exam: Temp:  [98.9 F (37.2 C)] 98.9 F (37.2 C) (03/09 1446) Pulse Rate:  [78] 78 (03/09 1446) Resp:  [20] 20 (03/09 1446) BP: (123)/(53) 123/53 (03/09 1446) SpO2:  [97 %] 97 % (03/09 1446)   Patient is alert and in no acute distress. Conjunctiva is pink.  Sclera does not appear to be icteric. Oropharyngeal mucosa is normal. Neck without masses thyromegaly or lymphadenopathy. Cardiac exam with regular rhythm normal S1 and S2.  No murmur or gallop noted. Auscultation lungs reveal vesicular breath sounds bilaterally. Abdomen is full.  She has laparoscopy scars across upper abdomen and long scar spanning right flank extending posterolaterally and there is hernia towards the superior end of the scar which is completely reducible and not tender. She has nonpitting edema to both legs. No clubbing noted.   Lab Results: Recent Labs    02/12/20 2248 02/15/20 1513  WBC 8.1 6.2  HGB 15.0 14.9  HCT 46.2* 46.8*  PLT 308 323   BMET Recent Labs    02/12/20 2248 02/15/20 1513  NA 138 135  K 3.7 3.8  CL 102 100  CO2 27 24  GLUCOSE 137* 132*  BUN 11 10  CREATININE 0.81 0.85  CALCIUM 8.7* 8.7*   LFT Recent Labs    02/15/20 1513  PROT 7.5  ALBUMIN 3.4*  AST 292*  ALT 492*  ALKPHOS 187*  BILITOT 4.1*  Studies/Results: US Abdomen Limited RUQ  Result Date: 02/15/2020 CLINICAL DATA:   Right upper quadrant pain for 5 days EXAM: ULTRASOUND ABDOMEN LIMITED RIGHT UPPER QUADRANT COMPARISON:  02/13/2020. FINDINGS: Gallbladder: There are multiple small shadowing calculi layering dependently within the gallbladder, largest measuring 4 mm. There is no gallbladder wall thickening or pericholecystic fluid. Patient did display tenderness during performance of the ultrasound according to the sonographer. Common bile duct: Diameter: 7 mm Liver: Liver demonstrates normal echotexture. Mild intrahepatic biliary duct dilation. No focal liver abnormalities. Portal vein is patent on color Doppler imaging with normal direction of blood flow towards the liver. Other: None. IMPRESSION: 1. Cholelithiasis without evidence of acute cholecystitis. 2. Mild intrahepatic biliary duct dilation, though normal caliber is seen within the common bile duct. Choledocholithiasis was reported in the downstream common bile duct on prior CT, and the mild intrahepatic biliary duct dilation is unchanged since that exam. Electronically Signed   By: Randa Ngo M.D.   On: 02/15/2020 16:15   I have also reviewed abdominopelvic CT from 06/27/2020 revealing multiple gallstones prominent biliary tree and questionable filling defects in distal CBD concerning for stones.  Assessment;  Patient is 79 year old Caucasian female who has been experiencing intermittent epigastric pain associated nausea and vomiting for about 6 months.  She now has developed elevated transaminases and rising bilirubin.  Imaging studies(CT and ultrasound) show multiple small gallstones prominent intrahepatic be radicles and questionable filling defects in distal CBD.  Serum lipase is mildly elevated.  No CT evidence of pancreatitis on CT of 2 days ago. Suspect choledocholithiasis.  She may have suffered mild biliary pancreatitis as well. She will need an ERCP prior to laparoscopic cholecystectomy unless there is significant drop in transaminases and bilirubin  tomorrow morning.  Recommendations;  LFTs in a.m along with INR serum amylase and lipase. Patient can have clear liquids tonight but should be n.p.o. after midnight. Begin ceftriaxone 2 g IV every 24 hours. Surgical consultation. No anticoagulants for now.   LOS: 0 days   Carol Doyle  02/15/2020, 6:46 PM

## 2020-02-16 ENCOUNTER — Other Ambulatory Visit: Payer: Self-pay

## 2020-02-16 ENCOUNTER — Inpatient Hospital Stay (HOSPITAL_COMMUNITY): Payer: Medicare Other

## 2020-02-16 ENCOUNTER — Ambulatory Visit (INDEPENDENT_AMBULATORY_CARE_PROVIDER_SITE_OTHER): Payer: Medicare Other | Admitting: Internal Medicine

## 2020-02-16 ENCOUNTER — Encounter (HOSPITAL_COMMUNITY): Payer: Self-pay | Admitting: Internal Medicine

## 2020-02-16 ENCOUNTER — Encounter (HOSPITAL_COMMUNITY)
Admission: EM | Disposition: A | Discharge status: Home / Self Care | Payer: Self-pay | Source: Home / Self Care | Attending: Internal Medicine

## 2020-02-16 ENCOUNTER — Inpatient Hospital Stay (HOSPITAL_COMMUNITY): Payer: Medicare Other | Admitting: Anesthesiology

## 2020-02-16 DIAGNOSIS — K810 Acute cholecystitis: Secondary | ICD-10-CM

## 2020-02-16 DIAGNOSIS — K8050 Calculus of bile duct without cholangitis or cholecystitis without obstruction: Secondary | ICD-10-CM

## 2020-02-16 DIAGNOSIS — R7303 Prediabetes: Secondary | ICD-10-CM

## 2020-02-16 DIAGNOSIS — K8063 Calculus of gallbladder and bile duct with acute cholecystitis with obstruction: Secondary | ICD-10-CM

## 2020-02-16 DIAGNOSIS — K821 Hydrops of gallbladder: Secondary | ICD-10-CM | POA: Diagnosis not present

## 2020-02-16 DIAGNOSIS — R748 Abnormal levels of other serum enzymes: Secondary | ICD-10-CM

## 2020-02-16 DIAGNOSIS — K8000 Calculus of gallbladder with acute cholecystitis without obstruction: Secondary | ICD-10-CM | POA: Diagnosis not present

## 2020-02-16 HISTORY — PX: CHOLECYSTECTOMY: SHX55

## 2020-02-16 LAB — CBC
HCT: 41.3 % (ref 36.0–46.0)
Hemoglobin: 13.6 g/dL (ref 12.0–15.0)
MCH: 30 pg (ref 26.0–34.0)
MCHC: 32.9 g/dL (ref 30.0–36.0)
MCV: 91.2 fL (ref 80.0–100.0)
Platelets: 298 10*3/uL (ref 150–400)
RBC: 4.53 MIL/uL (ref 3.87–5.11)
RDW: 13.8 % (ref 11.5–15.5)
WBC: 7.4 10*3/uL (ref 4.0–10.5)
nRBC: 0 % (ref 0.0–0.2)

## 2020-02-16 LAB — PROTIME-INR
INR: 1.1 (ref 0.8–1.2)
Prothrombin Time: 14 seconds (ref 11.4–15.2)

## 2020-02-16 LAB — HEPATIC FUNCTION PANEL
ALT: 354 U/L — ABNORMAL HIGH (ref 0–44)
AST: 188 U/L — ABNORMAL HIGH (ref 15–41)
Albumin: 2.9 g/dL — ABNORMAL LOW (ref 3.5–5.0)
Alkaline Phosphatase: 158 U/L — ABNORMAL HIGH (ref 38–126)
Bilirubin, Direct: 0.7 mg/dL — ABNORMAL HIGH (ref 0.0–0.2)
Indirect Bilirubin: 0.9 mg/dL (ref 0.3–0.9)
Total Bilirubin: 1.6 mg/dL — ABNORMAL HIGH (ref 0.3–1.2)
Total Protein: 6.5 g/dL (ref 6.5–8.1)

## 2020-02-16 LAB — BASIC METABOLIC PANEL
Anion gap: 8 (ref 5–15)
BUN: 9 mg/dL (ref 8–23)
CO2: 27 mmol/L (ref 22–32)
Calcium: 8.4 mg/dL — ABNORMAL LOW (ref 8.9–10.3)
Chloride: 105 mmol/L (ref 98–111)
Creatinine, Ser: 0.83 mg/dL (ref 0.44–1.00)
GFR calc Af Amer: >60 mL/min (ref >60)
GFR calc non Af Amer: >60 mL/min (ref >60)
Glucose, Bld: 107 mg/dL — ABNORMAL HIGH (ref 70–99)
Potassium: 3.8 mmol/L (ref 3.5–5.1)
Sodium: 140 mmol/L (ref 135–145)

## 2020-02-16 LAB — LIPASE, BLOOD: Lipase: 17 U/L (ref 11–51)

## 2020-02-16 LAB — HEMOGLOBIN A1C
Hgb A1c MFr Bld: 5.9 % — ABNORMAL HIGH (ref 4.8–5.6)
Mean Plasma Glucose: 122.63 mg/dL

## 2020-02-16 LAB — AMYLASE: Amylase: 45 U/L (ref 28–100)

## 2020-02-16 SURGERY — LAPAROSCOPIC CHOLECYSTECTOMY WITH INTRAOPERATIVE CHOLANGIOGRAM
Anesthesia: General | Site: Abdomen

## 2020-02-16 MED ORDER — SUGAMMADEX SODIUM 200 MG/2ML IV SOLN
INTRAVENOUS | Status: DC | PRN
  Administered 2020-02-16: 200 mg via INTRAVENOUS

## 2020-02-16 MED ORDER — SUCCINYLCHOLINE CHLORIDE 200 MG/10ML IV SOSY
PREFILLED_SYRINGE | INTRAVENOUS | Status: AC
  Filled 2020-02-16: qty 30

## 2020-02-16 MED ORDER — DEXAMETHASONE SODIUM PHOSPHATE 4 MG/ML IJ SOLN
INTRAMUSCULAR | Status: DC | PRN
  Administered 2020-02-16: 8 mg via INTRAVENOUS

## 2020-02-16 MED ORDER — SEVOFLURANE IN SOLN
RESPIRATORY_TRACT | Status: AC
  Filled 2020-02-16: qty 250

## 2020-02-16 MED ORDER — HYDROMORPHONE HCL 1 MG/ML IJ SOLN
0.2500 mg | INTRAMUSCULAR | Status: DC | PRN
  Administered 2020-02-16 (×3): 0.5 mg via INTRAVENOUS
  Filled 2020-02-16 (×3): qty 0.5

## 2020-02-16 MED ORDER — SODIUM CHLORIDE 0.9 % IV SOLN: INTRAVENOUS | Status: DC | PRN

## 2020-02-16 MED ORDER — PROPOFOL 10 MG/ML IV BOLUS
INTRAVENOUS | Status: AC
  Filled 2020-02-16: qty 20

## 2020-02-16 MED ORDER — SODIUM CHLORIDE 0.9 % IR SOLN
Status: DC | PRN
  Administered 2020-02-16: 3000 mL

## 2020-02-16 MED ORDER — HEMOSTATIC AGENTS (NO CHARGE) OPTIME
TOPICAL | Status: DC | PRN
  Administered 2020-02-16 (×2): 1 via TOPICAL

## 2020-02-16 MED ORDER — OXYCODONE HCL 5 MG PO TABS
5.0000 mg | ORAL_TABLET | ORAL | Status: DC | PRN
  Administered 2020-02-16 – 2020-02-17 (×2): 5 mg via ORAL
  Filled 2020-02-16 (×2): qty 1

## 2020-02-16 MED ORDER — PHENYLEPHRINE HCL (PRESSORS) 10 MG/ML IV SOLN
INTRAVENOUS | Status: DC | PRN
  Administered 2020-02-16: 60 ug via INTRAVENOUS

## 2020-02-16 MED ORDER — LACTATED RINGERS IV SOLN
INTRAVENOUS | Status: DC
  Administered 2020-02-16: 1000 mL via INTRAVENOUS

## 2020-02-16 MED ORDER — ONDANSETRON HCL 4 MG/2ML IJ SOLN
INTRAMUSCULAR | Status: AC
  Filled 2020-02-16: qty 2

## 2020-02-16 MED ORDER — SODIUM CHLORIDE 0.9 % IV SOLN
2.0000 g | INTRAVENOUS | Status: AC
  Administered 2020-02-16: 2 g via INTRAVENOUS

## 2020-02-16 MED ORDER — FENTANYL CITRATE (PF) 250 MCG/5ML IJ SOLN
INTRAMUSCULAR | Status: AC
  Filled 2020-02-16: qty 5

## 2020-02-16 MED ORDER — MORPHINE SULFATE (PF) 2 MG/ML IV SOLN
2.0000 mg | INTRAVENOUS | Status: DC | PRN
  Administered 2020-02-16 – 2020-02-17 (×3): 2 mg via INTRAVENOUS
  Filled 2020-02-16 (×3): qty 1

## 2020-02-16 MED ORDER — SUCCINYLCHOLINE CHLORIDE 200 MG/10ML IV SOSY
PREFILLED_SYRINGE | INTRAVENOUS | Status: DC | PRN
  Administered 2020-02-16: 120 mg via INTRAVENOUS

## 2020-02-16 MED ORDER — LIDOCAINE HCL (CARDIAC) PF 100 MG/5ML IV SOSY
PREFILLED_SYRINGE | INTRAVENOUS | Status: DC | PRN
  Administered 2020-02-16: 100 mg via INTRAVENOUS

## 2020-02-16 MED ORDER — SUGAMMADEX SODIUM 200 MG/2ML IV SOLN: INTRAVENOUS | Status: DC | PRN

## 2020-02-16 MED ORDER — BUPIVACAINE HCL (PF) 0.5 % IJ SOLN
INTRAMUSCULAR | Status: AC
  Filled 2020-02-16: qty 30

## 2020-02-16 MED ORDER — DEXAMETHASONE SODIUM PHOSPHATE 10 MG/ML IJ SOLN
INTRAMUSCULAR | Status: AC
  Filled 2020-02-16: qty 1

## 2020-02-16 MED ORDER — SODIUM CHLORIDE 0.9 % IV SOLN
INTRAVENOUS | Status: AC
  Filled 2020-02-16: qty 2

## 2020-02-16 MED ORDER — ROCURONIUM BROMIDE 100 MG/10ML IV SOLN
INTRAVENOUS | Status: DC | PRN
  Administered 2020-02-16: 30 mg via INTRAVENOUS
  Administered 2020-02-16: 10 mg via INTRAVENOUS

## 2020-02-16 MED ORDER — ROCURONIUM BROMIDE 10 MG/ML (PF) SYRINGE
PREFILLED_SYRINGE | INTRAVENOUS | Status: AC
  Filled 2020-02-16: qty 10

## 2020-02-16 MED ORDER — FENTANYL CITRATE (PF) 100 MCG/2ML IJ SOLN
INTRAMUSCULAR | Status: DC | PRN
  Administered 2020-02-16: 50 ug via INTRAVENOUS
  Administered 2020-02-16: 25 ug via INTRAVENOUS
  Administered 2020-02-16: 50 ug via INTRAVENOUS
  Administered 2020-02-16: 25 ug via INTRAVENOUS
  Administered 2020-02-16 (×2): 50 ug via INTRAVENOUS

## 2020-02-16 MED ORDER — BUPIVACAINE LIPOSOME 1.3 % IJ SUSP
INTRAMUSCULAR | Status: AC
  Filled 2020-02-16: qty 20

## 2020-02-16 MED ORDER — LIDOCAINE 2% (20 MG/ML) 5 ML SYRINGE
INTRAMUSCULAR | Status: AC
  Filled 2020-02-16: qty 5

## 2020-02-16 MED ORDER — CHLORHEXIDINE GLUCONATE CLOTH 2 % EX PADS
6.0000 | MEDICATED_PAD | Freq: Once | CUTANEOUS | Status: DC
  Administered 2020-02-16: 6 via TOPICAL

## 2020-02-16 MED ORDER — 0.9 % SODIUM CHLORIDE (POUR BTL) OPTIME
TOPICAL | Status: DC | PRN
  Administered 2020-02-16: 1000 mL

## 2020-02-16 MED ORDER — PROPOFOL 10 MG/ML IV BOLUS
INTRAVENOUS | Status: DC | PRN
  Administered 2020-02-16: 150 mg via INTRAVENOUS

## 2020-02-16 MED ORDER — POTASSIUM CHLORIDE IN NACL 20-0.9 MEQ/L-% IV SOLN: INTRAVENOUS | Status: AC

## 2020-02-16 MED ORDER — BUPIVACAINE HCL (PF) 0.5 % IJ SOLN
INTRAMUSCULAR | Status: DC | PRN
  Administered 2020-02-16: 10 mL

## 2020-02-16 SURGICAL SUPPLY — 55 items
APPLICATOR ARISTA FLEXITIP XL (MISCELLANEOUS) ×2 IMPLANT
APPLIER CLIP ROT 10 11.4 M/L (STAPLE) ×3
BAG RETRIEVAL 10MM (BASKET) ×1
BLADE SURG 15 STRL LF DISP TIS (BLADE) ×1 IMPLANT
BLADE SURG 15 STRL SS (BLADE) ×2
CATH CHOLANGIOGRAM 4.5FR (CATHETERS) ×3 IMPLANT
CHLORAPREP W/TINT 26 (MISCELLANEOUS) ×3 IMPLANT
CLIP APPLIE ROT 10 11.4 M/L (STAPLE) ×1 IMPLANT
CLOTH BEACON ORANGE TIMEOUT ST (SAFETY) ×3 IMPLANT
COVER LIGHT HANDLE STERIS (MISCELLANEOUS) ×6 IMPLANT
COVER WAND RF STERILE (DRAPES) ×3 IMPLANT
DECANTER SPIKE VIAL GLASS SM (MISCELLANEOUS) ×6 IMPLANT
DERMABOND ADVANCED (GAUZE/BANDAGES/DRESSINGS) ×2
DERMABOND ADVANCED .7 DNX12 (GAUZE/BANDAGES/DRESSINGS) ×1 IMPLANT
DRAPE C-ARM FOLDED MOBILE STRL (DRAPES) ×3 IMPLANT
ELECT REM PT RETURN 9FT ADLT (ELECTROSURGICAL) ×3
ELECTRODE REM PT RTRN 9FT ADLT (ELECTROSURGICAL) ×1 IMPLANT
EVACUATOR DRAINAGE 10X20 100CC (DRAIN) IMPLANT
EVACUATOR SILICONE 100CC (DRAIN) ×2
GLOVE BIO SURGEON STRL SZ 6.5 (GLOVE) ×2 IMPLANT
GLOVE BIO SURGEONS STRL SZ 6.5 (GLOVE) ×1
GLOVE BIOGEL PI IND STRL 6.5 (GLOVE) ×1 IMPLANT
GLOVE BIOGEL PI IND STRL 7.0 (GLOVE) ×3 IMPLANT
GLOVE BIOGEL PI INDICATOR 6.5 (GLOVE) ×2
GLOVE BIOGEL PI INDICATOR 7.0 (GLOVE) ×6
GLOVE ECLIPSE 6.5 STRL STRAW (GLOVE) ×4 IMPLANT
GLOVE SURG SS PI 7.5 STRL IVOR (GLOVE) ×6 IMPLANT
GOWN STRL REUS W/TWL LRG LVL3 (GOWN DISPOSABLE) ×11 IMPLANT
HEMOSTAT ARISTA ABSORB 3G PWDR (HEMOSTASIS) ×2 IMPLANT
HEMOSTAT SNOW SURGICEL 2X4 (HEMOSTASIS) ×3 IMPLANT
INST SET LAPROSCOPIC AP (KITS) ×3 IMPLANT
IV NS IRRIG 3000ML ARTHROMATIC (IV SOLUTION) ×2 IMPLANT
KIT TURNOVER KIT A (KITS) ×3 IMPLANT
MANIFOLD NEPTUNE II (INSTRUMENTS) ×3 IMPLANT
NEEDLE INSUFFLATION 120MM (ENDOMECHANICALS) ×3 IMPLANT
NS IRRIG 1000ML POUR BTL (IV SOLUTION) ×3 IMPLANT
PACK LAP CHOLE LZT030E (CUSTOM PROCEDURE TRAY) ×3 IMPLANT
PAD ARMBOARD 7.5X6 YLW CONV (MISCELLANEOUS) ×3 IMPLANT
SET BASIN LINEN APH (SET/KITS/TRAYS/PACK) ×3 IMPLANT
SET TUBE IRRIG SUCTION NO TIP (IRRIGATION / IRRIGATOR) ×2 IMPLANT
SET TUBE SMOKE EVAC HIGH FLOW (TUBING) ×3 IMPLANT
SLEEVE ENDOPATH XCEL 5M (ENDOMECHANICALS) ×3 IMPLANT
SUT ETHILON 3 0 FSL (SUTURE) ×2 IMPLANT
SUT MNCRL AB 4-0 PS2 18 (SUTURE) ×3 IMPLANT
SUT VICRYL 0 UR6 27IN ABS (SUTURE) ×3 IMPLANT
SYR 20ML LL LF (SYRINGE) ×3 IMPLANT
SYR 30ML LL (SYRINGE) ×3 IMPLANT
SYR CONTROL 10ML LL (SYRINGE) ×3 IMPLANT
SYS BAG RETRIEVAL 10MM (BASKET) ×2
SYSTEM BAG RETRIEVAL 10MM (BASKET) ×1 IMPLANT
TROCAR ENDO BLADELESS 11MM (ENDOMECHANICALS) ×3 IMPLANT
TROCAR XCEL NON-BLD 5MMX100MML (ENDOMECHANICALS) ×3 IMPLANT
TROCAR XCEL UNIV SLVE 11M 100M (ENDOMECHANICALS) ×3 IMPLANT
WARMER LAPAROSCOPE (MISCELLANEOUS) ×3 IMPLANT
YANKAUER SUCT 12FT TUBE ARGYLE (SUCTIONS) ×3 IMPLANT

## 2020-02-16 NOTE — Progress Notes (Signed)
PROGRESS NOTE  Carol Doyle ZOX:[id number] DOB: February 04, 1941 DOA: 02/15/2020 PCP: Monico Blitz, MD  Brief History:  79 year old female with history of impaired glucose tolerance and renal cell carcinoma presenting with nausea, vomiting, and abdominal pain.  Notably, the patient was in the emergency department on 02/13/2020 with abdominal pain.  CT of the abdomen and pelvis at that time showed prominence of the biliary ductal system with question of possible stones passing into the common bile duct.  GI was consulted at that time, and the patient was sent home in stable condition with instructions to follow-up with Dr. Laural Golden.  Her abdominal pain had improved at the time of discharge.  However over the past 2 days, the patient had return of her upper abdominal pain with nausea and vomiting and poor oral intake.  As result, the patient presented for further evaluation.  In the ED, the patient was afebrile and hemodynamically stable with oxygen saturation 90% room air.  AST 292, ALT 192, alk phos 187, total bilirubin 4.1.  GI and general surgery was consulted for assistance.  Assessment/Plan: Choledocholithiasis -pt likely passed a biliary stone with drop in LFTs and bili -3/7 CT abd/pelvis as discussed -3/9 RUQ us--mild intrahepatic ductal dilatation -02/16/20--lap chole--Dr. Constance Haw -unable to perform IOC -may need MRCP 3/11  Acute cholecystitis -02/16/20--lap chole -post op care per general surgery  Pyuria -continue ceftriaxone pending culture data -UA.50WBC  Impaired Glucose Tolerance -a1c--5.9 -lifestyle modification       Disposition Plan: Patient From: Home/SNF D/C Place: Home/SNF - 1-2  When LFTs improve and MRCP neg Barriers: Not Clinically Stable--  Family Communication:   Family at bedside  Consultants:  GI, general surgery  Code Status:  FULL   DVT Prophylaxis:  [place] Heparin /  Lovenox   Procedures: As Listed in Progress Note  Above  Antibiotics: None       Subjective: Pt complains of postop abdominal pain.  Denies f/c, cp, sob, n/v/d.  Objective: Vitals:   02/16/20 1654 02/16/20 1700 02/16/20 1715 02/16/20 1742  BP:  (!) 146/67 (!) 131/46 (!) 146/83  Pulse: 89 89 86 82  Resp: _0 Temp:   (!) 97.5 F (36.4 C) 97.8 F (36.6 C)  TempSrc:    Oral  SpO2: 95% 95% 95% 93%  Weight:      Height:        Intake/Output Summary (Last 24 hours) at 02/16/2020 1801 Last data filed at 02/16/2020 1700 Gross per 24 hour  Intake 2189.96 ml  Output 50 ml  Net 2139.96 ml   Weight change:  Exam:   General:  Pt is alert, follows commands appropriately, not in acute distress  HEENT: No icterus, No thrush, No neck mass, [place]/AT  Cardiovascular: RRR, S1/S2, no rubs, no gallops  Respiratory: bibasilar crackles. No wheeze  Abdomen: Soft/+BS, non tender, non distended, no guarding  Extremities: 1+ LE edema, No lymphangitis, No petechiae, No rashes, no synovitis   Data Reviewed: I have personally reviewed following labs and imaging studies Basic Metabolic Panel: Recent Labs  Lab 02/12/20 2248 02/15/20 1513 02/16/20 0458  NA 138 135 140  K 3.7 3.8 3.8  CL 102 100 105  CO2 _1 GLUCOSE 137* 132* 107*  BUN _2 CREATININE 0.81 0.85 0.83  CALCIUM 8.7* 8.7* 8.4*   Liver Function Tests: Recent Labs  Lab 02/12/20 2248 02/15/20 1513 02/16/20 0458  AST 589* 292* 188*  ALT 651* 492* 354*  ALKPHOS 157* 187* 158*  BILITOT 4.2* 4.1* 1.6*  PROT 7.4 7.5 6.5  ALBUMIN 3.4* 3.4* 2.9*   Recent Labs  Lab 02/12/20 2248 02/15/20 1513 02/16/20 0458  LIPASE 15 58* 17  AMYLASE  --   --  45   No results for input(s): AMMONIA in the last 168 hours. Coagulation Profile: Recent Labs  Lab 02/16/20 0458  INR 1.1   CBC: Recent Labs  Lab 02/12/20 2248 02/15/20 1513 02/16/20 0458  WBC 8.1 6.2 7.4  HGB 15.0 14.9 13.6  HCT 46.2* 46.8* 41.3  MCV 90.9 92.1 91.2  PLT 308 323 298    Cardiac Enzymes: No results for input(s): CKTOTAL, CKMB, CKMBINDEX, TROPONINI in the last 168 hours. BNP: Invalid input(s): POCBNP CBG: No results for input(s): GLUCAP in the last 168 hours. HbA1C: Recent Labs    02/15/20 1513  HGBA1C 5.9*   Urine analysis:    Component Value Date/Time   COLORURINE AMBER (A) 02/15/2020 1455   APPEARANCEUR HAZY (A) 02/15/2020 1455   LABSPEC 1.026 02/15/2020 1455   PHURINE 5.0 02/15/2020 1455   GLUCOSEU NEGATIVE 02/15/2020 1455   HGBUR NEGATIVE 02/15/2020 1455   BILIRUBINUR MODERATE (A) 02/15/2020 1455   KETONESUR 5 (A) 02/15/2020 1455   PROTEINUR 30 (A) 02/15/2020 1455   NITRITE POSITIVE (A) 02/15/2020 1455   LEUKOCYTESUR SMALL (A) 02/15/2020 1455   Sepsis Labs: _0 (procalcitonin:4,lacticidven:4) ) Recent Results (from the past 240 hour(s))  Respiratory Panel by RT PCR (Flu A&B, Covid) - Nasopharyngeal Swab     Status: None   Collection Time: 02/15/20  3:27 PM   Specimen: Nasopharyngeal Swab  Result Value Ref Range Status   SARS Coronavirus 2 by RT PCR NEGATIVE NEGATIVE Final    Comment: (NOTE) SARS-CoV-2 target nucleic acids are NOT DETECTED. The SARS-CoV-2 RNA is generally detectable in upper respiratoy specimens during the acute phase of infection. The lowest concentration of SARS-CoV-2 viral copies this assay can detect is 131 copies/mL. A negative result does not preclude SARS-Cov-2 infection and should not be used as the sole basis for treatment or other patient management decisions. A negative result may occur with  improper specimen collection/handling, submission of specimen other than nasopharyngeal swab, presence of viral mutation(s) within the areas targeted by this assay, and inadequate number of viral copies (<131 copies/mL). A negative result must be combined with clinical observations, patient history, and epidemiological information. The expected result is Negative. Fact Sheet for Patients:   PinkCheek.be Fact Sheet for Healthcare Providers:  GravelBags.it This test is not yet ap proved or cleared by the [place] FDA and  has been authorized for detection and/or diagnosis of SARS-CoV-2 by FDA under an Emergency Use Authorization (EUA). This EUA will remain  in effect (meaning this test can be used) for the duration of the COVID-19 declaration under Section 564(b)(1) of the Act, 21 U.S.C. section 360bbb-3(b)(1), unless the authorization is terminated or revoked sooner.    Influenza A by PCR NEGATIVE NEGATIVE Final   Influenza B by PCR NEGATIVE NEGATIVE Final    Comment: (NOTE) The Xpert Xpress SARS-CoV-2/FLU/RSV assay is intended as an aid in  the diagnosis of influenza from Nasopharyngeal swab specimens and  should not be used as a sole basis for treatment. Nasal washings and  aspirates are unacceptable for Xpert Xpress SARS-CoV-2/FLU/RSV  testing. Fact Sheet for Patients: PinkCheek.be Fact Sheet for Healthcare Providers: GravelBags.it This test is not yet approved or cleared by the [place] and  has been authorized for detection and/or diagnosis of SARS-CoV-2 by  FDA under an Emergency Use Authorization (EUA). This EUA will remain  in effect (meaning this test can be used) for the duration of the  Covid-19 declaration under Section 564(b)(1) of the Act, 21  U.S.C. section 360bbb-3(b)(1), unless the authorization is  terminated or revoked. Performed at [facility], [address], [place], [place] [address]      Scheduled Meds: . mometasone-formoterol  2 puff Inhalation BID   Continuous Infusions: . cefTRIAXone (ROCEPHIN)  IV 2 g (02/15/20 2055)    Procedures/Studies: DG Chest 2 View  Result Date: 02/12/2020 CLINICAL DATA:  Denice Paradise the gastric pain EXAM: CHEST - 2 VIEW COMPARISON:  07/10/2019 FINDINGS: The heart size and mediastinal  contours are within normal limits. Both lungs are clear. The visualized skeletal structures are unremarkable. IMPRESSION: No active cardiopulmonary disease. Electronically Signed   By: Constance Holster M.D.   On: 02/12/2020 22:56   CT Abdomen Pelvis W Contrast  Result Date: 02/13/2020 CLINICAL DATA:  Epigastric abdominal pain over the last 3 weeks, worse on the right. Question diverticulitis. History of hiatal hernia, kidney cancer with right nephrectomy and gastric surgery. EXAM: CT ABDOMEN AND PELVIS WITH CONTRAST TECHNIQUE: Multidetector CT imaging of the abdomen and pelvis was performed using the standard protocol following bolus administration of intravenous contrast. CONTRAST:  125m OMNIPAQUE IOHEXOL 300 MG/ML  SOLN COMPARISON:  Report from study 08/03/2014 FINDINGS: Lower chest: Chronic basilar scarring. No active infiltrate. No effusion. Hepatobiliary: No liver parenchymal lesion. Multiple small gallstones dependent in the gallbladder. Mildly prominent biliary ductal tree. The small stones are the sort that could pass into the common duct. One could question 1 or 2 small stones in the ampullary region, not definite. Pancreas: Fatty atrophy of the pancreas.  No mass or pancreatitis. Spleen: Normal Adrenals/Urinary Tract: Adrenal glands are normal. Previous right nephrectomy. Left kidney is normal. Bladder is normal. Stomach/Bowel: Diverticulosis of the left colon without evidence of diverticulitis. Right lateral abdominal wall hernia, possibly subsequent to the previous nephrectomy surgery. Hernia contains some colon, small bowel and mesentery, but there does not appear to be any obstruction or incarceration. Vascular/Lymphatic: Aortic atherosclerosis. No aneurysm. IVC is normal. No retroperitoneal adenopathy. Reproductive: Previous hysterectomy.  No pelvic mass. Other: No free fluid or air. Musculoskeletal: Ordinary lower lumbar degenerative changes. IMPRESSION: Multiple small gallstones dependent  within the gallbladder. Prominence of the biliary ductal tree. Question if 1 or 2 small stones have passed into the common duct and are present at the region of the ampulla. This could possibly be the acute symptomatic pathology. Previous right nephrectomy. Right posterolateral abdominal wall hernia probably subsequent to that surgery. Hernia contains mesentery, small and large bowel, but there is no evidence of obstruction or incarceration. Fatty atrophy of the pancreas. Aortic atherosclerosis. Diverticulosis without evidence of diverticulitis. Electronically Signed   By: MNelson ChimesM.D.   On: 02/13/2020 00:51   [place]oreaAbdomen Limited RUQ  Result Date: 02/15/2020 CLINICAL DATA:  Right upper quadrant pain for 5 days EXAM: ULTRASOUND ABDOMEN LIMITED RIGHT UPPER QUADRANT COMPARISON:  02/13/2020. FINDINGS: Gallbladder: There are multiple small shadowing calculi layering dependently within the gallbladder, largest measuring 4 mm. There is no gallbladder wall thickening or pericholecystic fluid. Patient did display tenderness during performance of the ultrasound according to the sonographer. Common bile duct: Diameter: 7 mm Liver: Liver demonstrates normal echotexture. Mild intrahepatic biliary duct dilation. No focal liver abnormalities. Portal vein is patent on color Doppler imaging with  normal direction of blood flow towards the liver. Other: None. IMPRESSION: 1. Cholelithiasis without evidence of acute cholecystitis. 2. Mild intrahepatic biliary duct dilation, though normal caliber is seen within the common bile duct. Choledocholithiasis was reported in the downstream common bile duct on prior CT, and the mild intrahepatic biliary duct dilation is unchanged since that exam. Electronically Signed   By: Randa Ngo M.D.   On: 02/15/2020 16:15    Orson Eva, DO  [facility]  If 7PM-7AM, please contact night-coverage [email or web address] Password [email or web address] 02/16/2020, 6:01 PM   LOS: 1 day

## 2020-02-16 NOTE — Progress Notes (Signed)
  Subjective:  Patient denies epigastric or right upper quadrant abdominal pain.  She is not sick on her stomach today.  She complains of discomfort every time she voids.  She does not feel that she is able to empty her bladder when she voids.  No fever or chills reported.  Objective: Blood pressure (!) 122/59, pulse 70, temperature 98 F (36.7 C), temperature source Oral, resp. rate 19, height '5\' 7"'$  (1.702 m), weight 117.8 kg, SpO2 97 %. Patient is alert and in no acute distress. Abdomen is full but soft and nontender without organomegaly or masses.  She has incisional hernia in the right flank posterolaterally.  Labs/studies Results:  CBC Latest Ref Rng & Units 02/16/2020 02/15/2020 02/12/2020  WBC 4.0 - 10.5 K/uL 7.4 6.2 8.1  Hemoglobin 12.0 - 15.0 g/dL 13.6 14.9 15.0  Hematocrit 36.0 - 46.0 % 41.3 46.8(H) 46.2(H)  Platelets 150 - 400 K/uL 298 323 308    CMP Latest Ref Rng & Units 02/16/2020 02/15/2020 02/12/2020  Glucose 70 - 99 mg/dL 107(H) 132(H) 137(H)  BUN 8 - 23 mg/dL '9 10 11  '$ Creatinine 0.44 - 1.00 mg/dL 0.83 0.85 0.81  Sodium 135 - 145 mmol/L 140 135 138  Potassium 3.5 - 5.1 mmol/L 3.8 3.8 3.7  Chloride 98 - 111 mmol/L 105 100 102  CO2 22 - 32 mmol/L '27 24 27  '$ Calcium 8.9 - 10.3 mg/dL 8.4(L) 8.7(L) 8.7(L)  Total Protein 6.5 - 8.1 g/dL 6.5 7.5 7.4  Total Bilirubin 0.3 - 1.2 mg/dL 1.6(H) 4.1(H) 4.2(H)  Alkaline Phos 38 - 126 U/L 158(H) 187(H) 157(H)  AST 15 - 41 U/L 188(H) 292(H) 589(H)  ALT 0 - 44 U/L 354(H) 492(H) 651(H)    Hepatic Function Latest Ref Rng & Units 02/16/2020 02/15/2020 02/12/2020  Total Protein 6.5 - 8.1 g/dL 6.5 7.5 7.4  Albumin 3.5 - 5.0 g/dL 2.9(L) 3.4(L) 3.4(L)  AST 15 - 41 U/L 188(H) 292(H) 589(H)  ALT 0 - 44 U/L 354(H) 492(H) 651(H)  Alk Phosphatase 38 - 126 U/L 158(H) 187(H) 157(H)  Total Bilirubin 0.3 - 1.2 mg/dL 1.6(H) 4.1(H) 4.2(H)  Bilirubin, Direct 0.0 - 0.2 mg/dL 0.7(H) - -    Urine analysis abnormal.  Culture is pending.  Serum amylase 45 and  lipase 17.  INR 1.1   Assessment:  #1.  Abnormal LFTs.  Elevated bilirubin and transaminases felt to be secondary to choledocholithiasis.  Significant drop in bilirubin and transaminases since yesterday.  She is no pain-free.  I suspect she has passed a stone which she probably has been doing for a while.  Since of bile duct is not dilated may proceed with cholecystectomy with intraoperative cholangiogram.  Discussed with Dr. Constance Haw.  #2.  Urinary tract infection.  Patient is on ceftriaxone which should be sufficient.  Cultures are pending.  Have asked nursing staff to do bladder scan to make sure she is able to empty her bladder otherwise she will need Foley's catheter.  #3.  Symptomatic cholelithiasis.  Patient will need laparoscopic cholecystectomy.   Recommendations  No plans for ERCP as bilirubin and transaminases are coming down. Cholecystectomy with intraoperative cholangiogram. IV fluid rate increased to 150 mL/h. Bladder scan after she voids to make sure she does not have a large residual which case she will need catheter temporarily.

## 2020-02-16 NOTE — Progress Notes (Addendum)
[facility]  Spoke with Levada Dy, notified of lap chole and attempted cholangiogram, drain in place to monitor for any bile leak. Plan for diet now, labs in AM, home tomorrow with drain and plan for drain out next week as long as stays bloody/ serous and not bilious.   Potential for MRCP tomorrow to assess biliary sytem since could not get IOC.  NPO at midnight until labs back in AM and we can make a decision.   Curlene Labrum, MD [facility] [address] [place], [place] [address] [phone] (office)

## 2020-02-16 NOTE — Anesthesia Preprocedure Evaluation (Signed)
Anesthesia Evaluation  Patient identified by MRN, date of birth, ID band Patient awake    Reviewed: Allergy & Precautions, H&P , NPO status , Patient's Chart, lab work & pertinent test results, reviewed documented beta blocker date and time   Airway Mallampati: II  TM Distance: >3 FB Neck ROM: full    Dental no notable dental hx. (+) Implants   Pulmonary pneumonia, resolved, former smoker,    Pulmonary exam normal        Cardiovascular negative cardio ROS Normal cardiovascular exam     Neuro/Psych PSYCHIATRIC DISORDERS Anxiety negative neurological ROS     GI/Hepatic Neg liver ROS, hiatal hernia,   Endo/Other  negative endocrine ROS  Renal/GU Renal disease  negative genitourinary   Musculoskeletal   Abdominal   Peds  Hematology negative hematology ROS (+)   Anesthesia Other Findings   Reproductive/Obstetrics                             Anesthesia Physical Anesthesia Plan  ASA: II  Anesthesia Plan: General   Post-op Pain Management:    Induction:   PONV Risk Score and Plan: 3 and Ondansetron  Airway Management Planned:   Additional Equipment:   Intra-op Plan:   Post-operative Plan:   Informed Consent: I have reviewed the patients History and Physical, chart, labs and discussed the procedure including the risks, benefits and alternatives for the proposed anesthesia with the patient or authorized representative who has indicated his/her understanding and acceptance.       Plan Discussed with: CRNA  Anesthesia Plan Comments:         Anesthesia Quick Evaluation

## 2020-02-16 NOTE — Progress Notes (Signed)
Patient states that jello causes her abdominal pain.

## 2020-02-16 NOTE — Consult Note (Signed)
Reason for Consult:cholelithiasis Referring Physician: Dr. Soyla Dryer Carol Doyle is an 79 y.o. female.  HPI: Pt presents to clinic with a 6 month history of abdominal pain located in the upper abdomen and substernal areas that radiates to her back. The pain has been worsening in severity over the past month. Pt states that the pain is worse with eating and drinking. She reports that the pain is intermittent and non-relenting, and she describes feeling "like a truck ran over her" when the pain is present. Associated symptoms include nausea, bilious vomiting, diarrhea, and palpitations. Pt denies fever, chills, and constipation.  Past Medical History:  Diagnosis Date  . Anemia    blood transfusion "many years ago before 1970"  . Anxiety   . Arthritis   . Cancer [facility])    kidney cancer  . History of hiatal hernia 2009   incisional hernia  . Pneumonia    years ago, possibly age 71  . Pre-diabetes     Past Surgical History:  Procedure Laterality Date  . ABDOMINAL HYSTERECTOMY     1971  . COLONOSCOPY W/ POLYPECTOMY  2017  . LAPAROSCOPIC GASTRIC SLEEVE RESECTION  2008  . NEPHRECTOMY Right 2008  . TONSILLECTOMY     childhood  . 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 TRIGGER FINGER RELEASE      History reviewed. No pertinent family history.  Social History:  reports that she has quit smoking. Her smoking use included cigarettes. She has never used smokeless tobacco. She reports that she does not drink alcohol or use drugs.  Allergies: No Known Allergies  Medications: I have reviewed the patient's current medications. Pt also takes turmeric as needed for knee pain and vitamin D 5000 IU occasionally.  Results for orders placed or performed during the hospital encounter of 02/15/20 (from the past 48 hour(s))  Urinalysis, Routine w reflex  microscopic     Status: Abnormal   Collection Time: 02/15/20  2:55 PM  Result Value Ref Range   Color, Urine AMBER (A) YELLOW    Comment: BIOCHEMICALS MAY BE AFFECTED BY COLOR   APPearance HAZY (A) CLEAR   Specific Gravity, Urine 1.026 1.005 - 1.030   pH 5.0 5.0 - 8.0   Glucose, UA NEGATIVE NEGATIVE mg/dL   Hgb urine dipstick NEGATIVE NEGATIVE   Bilirubin Urine MODERATE (A) NEGATIVE   Ketones, ur 5 (A) NEGATIVE mg/dL   Protein, ur 30 (A) NEGATIVE mg/dL   Nitrite POSITIVE (A) NEGATIVE   Leukocytes,Ua SMALL (A) NEGATIVE   RBC / HPF 0-5 0 - 5 RBC/hpf   WBC, UA >50 (H) 0 - 5 WBC/hpf   Bacteria, UA RARE (A) NONE SEEN   Squamous Epithelial / LPF 0-5 0 - 5   Mucus PRESENT     Comment: Performed at [facility], [address] [place], [place] [address]  Lipase, blood     Status: Abnormal   Collection Time: 02/15/20  3:13 PM  Result Value Ref Range   Lipase 58 (H) 11 - 51 U/L    Comment: Performed at [facility], [address], [place], [place] [address]  Comprehensive metabolic panel     Status: Abnormal   Collection Time: 02/15/20  3:13 PM  Result Value Ref Range   Sodium 135 135 - 145 mmol/L   Potassium 3.8 3.5 - 5.1 mmol/L   Chloride 100 98 -  111 mmol/L   CO2 24 22 - 32 mmol/L   Glucose, Bld 132 (H) 70 - 99 mg/dL    Comment: Glucose reference range applies only to samples taken after fasting for at least 8 hours.   BUN 10 8 - 23 mg/dL   Creatinine, Ser 0.85 0.44 - 1.00 mg/dL   Calcium 8.7 (L) 8.9 - 10.3 mg/dL   Total Protein 7.5 6.5 - 8.1 g/dL   Albumin 3.4 (L) 3.5 - 5.0 g/dL   AST 292 (H) 15 - 41 U/L   ALT 492 (H) 0 - 44 U/L   Alkaline Phosphatase 187 (H) 38 - 126 U/L   Total Bilirubin 4.1 (H) 0.3 - 1.2 mg/dL   GFR calc non Af Amer >60 >60 mL/min   GFR calc Af Amer >60 >60 mL/min   Anion gap 11 5 - 15    Comment: Performed at [facility], [address], [place], [place] [address]  CBC     Status: Abnormal   Collection Time: 02/15/20  3:13 PM  Result Value Ref Range    WBC 6.2 4.0 - 10.5 K/uL   RBC 5.08 3.87 - 5.11 MIL/uL   Hemoglobin 14.9 12.0 - 15.0 g/dL   HCT 46.8 (H) 36.0 - 46.0 %   MCV 92.1 80.0 - 100.0 fL   MCH 29.3 26.0 - 34.0 pg   MCHC 31.8 30.0 - 36.0 g/dL   RDW 13.4 11.5 - 15.5 %   Platelets 323 150 - 400 K/uL   nRBC 0.0 0.0 - 0.2 %    Comment: Performed at [facility], [address] [place], [place] [address]  Hemoglobin A1c     Status: Abnormal   Collection Time: 02/15/20  3:13 PM  Result Value Ref Range   Hgb A1c MFr Bld 5.9 (H) 4.8 - 5.6 %    Comment: (NOTE) Pre diabetes:          5.7%-6.4% Diabetes:              >6.4% Glycemic control for   <7.0% adults with diabetes    Mean Plasma Glucose 122.63 mg/dL    Comment: Performed at [facility], [place] [address], [place], [place] [address]  Respiratory Panel by RT PCR (Flu A&B, Covid) - Nasopharyngeal Swab     Status: None   Collection Time: 02/15/20  3:27 PM   Specimen: Nasopharyngeal Swab  Result Value Ref Range   SARS Coronavirus 2 by RT PCR NEGATIVE NEGATIVE    Comment: (NOTE) SARS-CoV-2 target nucleic acids are NOT DETECTED. The SARS-CoV-2 RNA is generally detectable in upper respiratoy specimens during the acute phase of infection. The lowest concentration of SARS-CoV-2 viral copies this assay can detect is 131 copies/mL. A negative result does not preclude SARS-Cov-2 infection and should not be used as the sole basis for treatment or other patient management decisions. A negative result may occur with  improper specimen collection/handling, submission of specimen other than nasopharyngeal swab, presence of viral mutation(s) within the areas targeted by this assay, and inadequate number of viral copies (<131 copies/mL). A negative result must be combined with clinical observations, patient history, and epidemiological information. The expected result is Negative. Fact Sheet for Patients:  PinkCheek.be Fact Sheet for Healthcare Providers:   GravelBags.it This test is not yet ap proved or cleared by the [place] FDA and  has been authorized for detection and/or diagnosis of SARS-CoV-2 by FDA under an Emergency Use Authorization (EUA). This EUA will remain  in effect (meaning this test can be used) for the duration of the COVID-19 declaration under Section 564(b)(1) of the Act, 21 U.S.C. section 360bbb-3(b)(1), unless the authorization is terminated or revoked sooner.    Influenza A by PCR NEGATIVE NEGATIVE   Influenza B by PCR NEGATIVE NEGATIVE    Comment: (NOTE) The Xpert Xpress SARS-CoV-2/FLU/RSV assay is intended as an aid in  the diagnosis of influenza from Nasopharyngeal swab specimens and  should not be used as a sole basis for treatment. Nasal washings and  aspirates are unacceptable for Xpert Xpress SARS-CoV-2/FLU/RSV  testing. Fact Sheet for Patients: PinkCheek.be Fact Sheet for Healthcare Providers: GravelBags.it This test is not yet approved or cleared by the [place] FDA and  has been authorized for detection and/or diagnosis of SARS-CoV-2 by  FDA under an Emergency Use Authorization (EUA). This EUA will remain  in effect (meaning this test can be used) for the duration of the  Covid-19 declaration under Section 564(b)(1) of the Act, 21  U.S.C. section 360bbb-3(b)(1), unless the authorization is  terminated or revoked. Performed at [facility], [address], [place], [place] [address]   Hepatic function panel     Status: Abnormal   Collection Time: 02/16/20  4:58 AM  Result Value Ref Range   Total Protein 6.5 6.5 - 8.1 g/dL   Albumin 2.9 (L) 3.5 - 5.0 g/dL   AST 188 (H) 15 - 41 U/L   ALT 354 (H) 0 - 44 U/L   Alkaline Phosphatase 158 (H) 38 - 126 U/L   Total Bilirubin 1.6 (H) 0.3 - 1.2 mg/dL   Bilirubin, Direct 0.7 (H) 0.0 - 0.2 mg/dL   Indirect Bilirubin 0.9 0.3 - 0.9 mg/dL    Comment: Performed at [facility], [address] [place], [place] [address]  Amylase     Status: None   Collection Time: 02/16/20  4:58 AM  Result Value Ref Range   Amylase 45 28 - 100 U/L    Comment: Performed at [facility], [address] [place], [place] [address]  Lipase, blood     Status: None   Collection Time: 02/16/20  4:58 AM  Result Value Ref Range   Lipase 17 11 - 51 U/L    Comment: Performed at [facility], [address], [place], [place] [address]  Protime-INR     Status: None   Collection Time: 02/16/20  4:58 AM  Result Value Ref Range   Prothrombin Time 14.0 11.4 - 15.2 seconds   INR 1.1 0.8 - 1.2    Comment: (NOTE) INR goal varies based on device and disease states. Performed at [facility], [address], [place], [place] [address]   CBC     Status: None   Collection Time: 02/16/20  4:58 AM  Result Value Ref Range   WBC 7.4 4.0 - 10.5 K/uL   RBC 4.53 3.87 - 5.11 MIL/uL   Hemoglobin 13.6 12.0 - 15.0 g/dL   HCT 41.3 36.0 - 46.0 %   MCV 91.2 80.0 - 100.0 fL   MCH 30.0 26.0 - 34.0 pg   MCHC 32.9 30.0 - 36.0 g/dL   RDW 13.8 11.5 - 15.5 %   Platelets 298 150 - 400 K/uL   nRBC 0.0 0.0 - 0.2 %    Comment: Performed at [facility], [address], [place], [place] [id number]  Basic metabolic panel     Status: Abnormal   Collection Time: 02/16/20  4:58 AM  Result Value Ref  Range   Sodium 140 135 - 145 mmol/L   Potassium 3.8 3.5 - 5.1 mmol/L   Chloride 105 98 - 111 mmol/L   CO2 27 22 - 32 mmol/L   Glucose, Bld 107 (H) 70 - 99 mg/dL    Comment: Glucose reference range applies only to samples taken after fasting for at least 8 hours.   BUN 9 8 - 23 mg/dL   Creatinine, Ser 0.83 0.44 - 1.00 mg/dL   Calcium 8.4 (L) 8.9 - 10.3 mg/dL   GFR calc non Af Amer >60 >60 mL/min   GFR calc Af Amer >60 >60 mL/min   Anion gap 8 5 - 15    Comment: Performed at [facility], [address], [place], [place] [address]    US Abdomen Limited RUQ  Result Date: 02/15/2020 CLINICAL DATA:  Right upper  quadrant pain for 5 days EXAM: ULTRASOUND ABDOMEN LIMITED RIGHT UPPER QUADRANT COMPARISON:  02/13/2020. FINDINGS: Gallbladder: There are multiple small shadowing calculi layering dependently within the gallbladder, largest measuring 4 mm. There is no gallbladder wall thickening or pericholecystic fluid. Patient did display tenderness during performance of the ultrasound according to the sonographer. Common bile duct: Diameter: 7 mm Liver: Liver demonstrates normal echotexture. Mild intrahepatic biliary duct dilation. No focal liver abnormalities. Portal vein is patent on color Doppler imaging with normal direction of blood flow towards the liver. Other: None. IMPRESSION: 1. Cholelithiasis without evidence of acute cholecystitis. 2. Mild intrahepatic biliary duct dilation, though normal caliber is seen within the common bile duct. Choledocholithiasis was reported in the downstream common bile duct on prior CT, and the mild intrahepatic biliary duct dilation is unchanged since that exam. Electronically Signed   By: Randa Ngo M.D.   On: 02/15/2020 16:15    ROS:  Pertinent items are noted in HPI.  Blood pressure (!) 122/59, pulse 70, temperature 98 F (36.7 C), temperature source Oral, resp. rate 19, height 5\' 7"  (1.702 m), weight 117.8 kg, SpO2 97 %. Physical Exam:  General: Pt is alert and oriented x3 and is in no acute distress. Resp: Lungs are CTA bilaterally. Cardio: S1, S2 normal. RRR. No gallops, rubs, or murmurs. Abdominal: Abdomen is soft and nontender. Normoactive bowel sounds are present. Mild discomfort in the RUQ and LUQ are present upon palpation.  Assessment/Plan: A: Pt is a 62-yr-old woman with abdominal pain likely attributable to cholelithiasis. Surgical intervention is recommended to address symptoms.  P: Pt will have laparoscopic cholecystecomy this afternoon.  Sheryle Spray 02/16/2020, 11:12 AM

## 2020-02-16 NOTE — Op Note (Signed)
[facility] Operative Note 02/16/20  Preoperative Diagnosis: Choledocholithiasis    Postoperative Diagnosis: Acute cholecystitis with hydrops, choledocholithiasis    Procedure(s) Performed: Laparoscopic cholecystectomy, attempted intraoperative cholangiogram (aborted)    Surgeon: Lanell Matar. Constance Haw, MD   Assistants: No qualified resident was available    Anesthesia: General endotracheal   Anesthesiologist: Louann Sjogren, MD    Specimens:  Gallbladder    Estimated Blood Loss: Minimal   Blood Replacement: None    Complications: None   Wound Class: Dirty/ Infected    Operative Indications:  Ms. Lavoy is a 79 yo with choledocholithiasis on CT scan and elevated bilirubin who has had down trending bilirubin and needs to have her gallbladder removed. I discussed with Dr. Laural Golden and given the down trending bilirubin and [place] with normal common bile duct diameter, we have opted to perform an intraoperative cholangiogram at the time of the cholecystectomy. I discussed the risk of bleeding, infection, bile duct injury, need for an open procedure and possibility of cholangiogram with the patient. She opted to proceed.   Findings: Occluded cystic duct with cholecystitis and hydrops, unable to cannulate the cystic duct for cholangiogram with catheter as saline continued to reflux, milked back stones, and re-attempted in multiple angles but catheter pierced back wall of friable cystic duct, so aborted cholangiogram, clips placed on cystic right at the CBD junction, and no evidence of bile leakage at this time ; cystic artery came anterior to the hepatic duct (see picture of critical view)       Procedure: The patient was taken to the operating room and placed supine. General endotracheal anesthesia was induced. Intravenous antibiotics were administered per protocol. An orogastric tube positioned to decompress the stomach. The abdomen was prepared and draped in the usual  sterile fashion.    A supraumbilical incision was made and a Veress technique was utilized to achieve pneumoperitoneum to 15 mmHg with carbon dioxide. A 11 mm optiview port was placed through the supraumbilical region, and a 10 mm 0-degree operative laparoscope was introduced. The area underlying the trocar and Veress needle were inspected and without evidence of injury.  Remaining trocars were placed under direct vision.  A 11 mm port was placed through the mid-epigastrium, near the falciform ligament as there were some minor adhesions between the anterior abdominal wall and colon, and the lateral edge of the liver. Once this was in, I carefully dissected out the liver edge and gallbladder from the colon with electrocautery. Then two 5 mm ports were placed in the right abdomen, between the anterior axillary and midclavicular line.   The gallbladder was distended and inflamed with a thickened rind. The stomach/ duodenum was also adherent to the medial wall.   The gallbladder fundus was elevated cephalad and the infundibulum was retracted to the patient's right. We could not get great retraction and elevation, so I attempted a partial dome down approach to get the gallbladder lifted up. The back wall of the gallbladder was necrotic, and the gallbladder was entered and there was signs of hydrops and stones.  This was controlled.  After this the gallbladder was decompressed, and we were better able to elevate cephalad. This allowed me to bluntly dissect down the duodenum and stomach from the medial wall of the gallbladder and peel off an inflammatory rind from the gallbladder wall.  Ultimately after much medial and lateral dissection and stripping of the rind, I was able to skeletonize the gallbladder/cystic duct junction. The cystic artery noted to come  from anterior to the hepatic duct and the triangle of Calot and was also skeletonized.  With continued liberal medial and lateral dissection until the critical  view of safety was achieved with the hepatic plate behind the two duct structures (see picture above).    The cystic artery was doublyclipped and divided. The cystic duct was clipped close to the gallbladder and was noted to be somewhat friable and actually tore with this clip placement. I tried to use this cystotomy from the tear, and tried multiple times to cannulate the cystic duct with the catheter. I was able to get it cannulated but there was significant reflux of saline. I milked back several stones in the cystic duct (which was occluded given the hydrops) and noted some bile, and re-attempted cannulation from various angles and ports. After multiple attempts this was not successful, and I actually pierced the back wall of the cystic duct with the catheter going through and through. I did not want to further tear or injure the bilary system, so I aborted the cholangiogram.  A clip was placed at the cystic duct, common bile duct junction and came across the duct entirely. Two additional clips were placed distal to this clip. I could not see the exact spot on the duct that was pierced posteriorly, but I never saw bile leak out, and felt good about the clip at the cystic duct/ common bile duct junction.    The gallbladder was then dissected from the liver bed with electrocautery. The specimen was placed in an Endopouch and was retrieved through the epigastric site. All stones were removed/ suctioned out.    Final inspection revealed acceptable hemostasis. Artista and Surgical SNOW were placed in the gallbladder bed.  A JP drain was left in the liver bed given the concern that a bile leak could develop from the cystic duct.  Trocars were removed and pneumoperitoneum was released.  0 Vicryl fascial sutures were used to close the epigastric and umbilical port sites. Skin incisions were closed with 4-0 Monocryl subcuticular sutures and Dermabond. The drain site was secured with 3-0 nylon out of the lateral  most port.  The patient was awakened from anesthesia and extubated without complication.  Updated Dr. Laural Golden on unsuccessful cholangiogram. We will plan to follow LFTs and I will leave the drain in for a week to assess for any leakage.    Curlene Labrum, MD [facility] [address] [place], [place] [address] [phone] (office)

## 2020-02-16 NOTE — Anesthesia Postprocedure Evaluation (Signed)
Anesthesia Post Note  Patient: Carol Doyle  Procedure(s) Performed: LAPAROSCOPIC CHOLECYSTECTOMY (N/A Abdomen)  Anesthesia Type: General     Last Vitals:  Vitals:   02/16/20 1621 02/16/20 1630  BP: (!) 148/54 (!) 148/64  Pulse: 95 91  Resp: 14 12  Temp: 36.6 C   SpO2: 96% 100%    Last Pain:  Vitals:   02/16/20 1621  TempSrc:   PainSc: 10-Worst pain ever   Pain improved over past 20 minutes.              Louann Sjogren

## 2020-02-16 NOTE — Anesthesia Procedure Notes (Signed)
Procedure Name: Intubation Date/Time: 02/16/2020 2:17 PM Performed by: Georgeanne Nim, CRNA Pre-anesthesia Checklist: Patient identified, Emergency Drugs available, Suction available, Patient being monitored and Timeout performed Patient Re-evaluated:Patient Re-evaluated prior to induction Oxygen Delivery Method: Circle system utilized Preoxygenation: Pre-oxygenation with 100% oxygen Induction Type: IV induction Ventilation: Mask ventilation without difficulty Laryngoscope Size: Mac and 4 Grade View: Grade I Tube type: Oral Tube size: 7.0 mm Number of attempts: 1 Airway Equipment and Method: Stylet Placement Confirmation: ETT inserted through vocal cords under direct vision,  positive ETCO2,  CO2 detector and breath sounds checked- equal and bilateral Secured at: 20 cm Tube secured with: Tape Dental Injury: Teeth and Oropharynx as per pre-operative assessment

## 2020-02-16 NOTE — Transfer of Care (Signed)
Immediate Anesthesia Transfer of Care Note  Patient: Carol Doyle  Procedure(s) Performed: LAPAROSCOPIC CHOLECYSTECTOMY (N/A Abdomen)  Patient Location: PACU  Anesthesia Type:General  Level of Consciousness: awake  Airway & Oxygen Therapy: Patient Spontanous Breathing  Post-op Assessment: Report given to RN  Post vital signs: Reviewed  Last Vitals:  Vitals Value Taken Time  BP 148/54 02/16/20 1621  Temp    Pulse 94 02/16/20 1627  Resp 15 02/16/20 1627  SpO2 100 % 02/16/20 1627  Vitals shown include unvalidated device data.  Last Pain:  Vitals:   02/16/20 1131  TempSrc: Oral  PainSc: 0-No pain      Patients Stated Pain Goal: 8 ([id number])  Complications: No apparent anesthesia complications

## 2020-02-17 ENCOUNTER — Inpatient Hospital Stay (HOSPITAL_COMMUNITY): Payer: Medicare Other

## 2020-02-17 LAB — CBC WITH DIFFERENTIAL/PLATELET
Abs Immature Granulocytes: 0.04 10*3/uL (ref 0.00–0.07)
Basophils Absolute: 0 10*3/uL (ref 0.0–0.1)
Basophils Relative: 0 %
Eosinophils Absolute: 0 10*3/uL (ref 0.0–0.5)
Eosinophils Relative: 0 %
HCT: 38.5 % (ref 36.0–46.0)
Hemoglobin: 12.3 g/dL (ref 12.0–15.0)
Immature Granulocytes: 0 %
Lymphocytes Relative: 13 %
Lymphs Abs: 1.6 10*3/uL (ref 0.7–4.0)
MCH: 29.8 pg (ref 26.0–34.0)
MCHC: 31.9 g/dL (ref 30.0–36.0)
MCV: 93.2 fL (ref 80.0–100.0)
Monocytes Absolute: 0.7 10*3/uL (ref 0.1–1.0)
Monocytes Relative: 5 %
Neutro Abs: 10.1 10*3/uL — ABNORMAL HIGH (ref 1.7–7.7)
Neutrophils Relative %: 82 %
Platelets: 312 10*3/uL (ref 150–400)
RBC: 4.13 MIL/uL (ref 3.87–5.11)
RDW: 14 % (ref 11.5–15.5)
WBC: 12.4 10*3/uL — ABNORMAL HIGH (ref 4.0–10.5)
nRBC: 0 % (ref 0.0–0.2)

## 2020-02-17 LAB — COMPREHENSIVE METABOLIC PANEL
ALT: 324 U/L — ABNORMAL HIGH (ref 0–44)
AST: 193 U/L — ABNORMAL HIGH (ref 15–41)
Albumin: 2.8 g/dL — ABNORMAL LOW (ref 3.5–5.0)
Alkaline Phosphatase: 126 U/L (ref 38–126)
Anion gap: 7 (ref 5–15)
BUN: 9 mg/dL (ref 8–23)
CO2: 26 mmol/L (ref 22–32)
Calcium: 8.1 mg/dL — ABNORMAL LOW (ref 8.9–10.3)
Chloride: 104 mmol/L (ref 98–111)
Creatinine, Ser: 0.87 mg/dL (ref 0.44–1.00)
GFR calc Af Amer: >60 mL/min (ref >60)
GFR calc non Af Amer: >60 mL/min (ref >60)
Glucose, Bld: 133 mg/dL — ABNORMAL HIGH (ref 70–99)
Potassium: 5.1 mmol/L (ref 3.5–5.1)
Sodium: 137 mmol/L (ref 135–145)
Total Bilirubin: 1.2 mg/dL (ref 0.3–1.2)
Total Protein: 6.3 g/dL — ABNORMAL LOW (ref 6.5–8.1)

## 2020-02-17 LAB — MAGNESIUM: Magnesium: 1.8 mg/dL (ref 1.7–2.4)

## 2020-02-17 MED ORDER — OXYCODONE HCL 5 MG PO TABS: 5.0000 mg | ORAL_TABLET | ORAL | 0 refills | Status: DC | PRN

## 2020-02-17 MED ORDER — DOCUSATE SODIUM 100 MG PO CAPS: 100.0000 mg | ORAL_CAPSULE | Freq: Two times a day (BID) | ORAL | 2 refills | Status: AC | PRN

## 2020-02-17 MED ORDER — ONDANSETRON HCL 4 MG PO TABS: 4.0000 mg | ORAL_TABLET | Freq: Four times a day (QID) | ORAL | 0 refills | Status: DC | PRN

## 2020-02-17 MED ORDER — PANTOPRAZOLE SODIUM 40 MG IV SOLR
40.0000 mg | Freq: Once | INTRAVENOUS | Status: AC
  Administered 2020-02-17: 40 mg via INTRAVENOUS
  Filled 2020-02-17: qty 40

## 2020-02-17 NOTE — Discharge Summary (Signed)
Physician Discharge Summary  Patient ID: Carol Doyle MRN: [id number] DOB/AGE: 1941/02/10 79 y.o.  Admit date: 02/15/2020 Discharge date: 02/18/2020  Admission Diagnoses: Choledocholithiasis   Discharge Diagnoses:  Active Problems:   Abdominal pain   Cholelithiasis   Prediabetes   Elevated liver enzymes   Choledocholithiasis   Cholelithiases   Acute cholecystitis   Discharged Condition: good  Hospital Course: Carol Doyle is a sweet 79 yo who came to the ED 2 days prior to admission and had finding of choledocolithiasis on CT and elevated T bili. She was sent home and instructed to follow up with Dr. Laural Golden. She called his office, and had an appointment as soon as he could get her in, and unfortunately she started having worsening pain and came back to the ED. She was admitted and plans for potential ERCP. She had down trending T bili and LFTs and improving pain. We opted to avoid ERCP and planned for laparoscopic cholecystectomy with IOC. The IOC was unsuccessful due to the inflammation and occluded cystic duct, and it was aborted. The Laparoscopic cholecystectomy was successful.  Due to concerns that she could have continued obstruction and risk for causing a bile leak at the friable cystic duct with back pressure, we obtained an MRCP the next day. This confirmed no continued choledocholithiasis and her labs were trending down and T bili was normal.  She felt well and was ready for home. She went home with a drain and plans to get repeat LFTs next week given concern for developing bile leak. The MRCP did show a post operative hematoma.  She was tolerating a diet and having good pain control prior to d/c home. After her d/c, a urine culture resulted with E Coli growth sensitive to bactrim. I called her and placed her on bactrim for this and she reported the symptom of pressure and no burning, which is her typical symptom for UTI.   Consults: GI and Hospitalist admission- surgery  took over an d/c done   Significant Diagnostic Studies:  02/17/2020 MRCP CLINICAL DATA:  Abdominal pain and elevated liver function tests. One day status post cholecystectomy. Evaluate for biliary obstruction and choledocholithiasis.  EXAM: MRI ABDOMEN WITHOUT CONTRAST  (INCLUDING MRCP)  TECHNIQUE: Multiplanar multisequence MR imaging of the abdomen was performed. Heavily T2-weighted images of the biliary and pancreatic ducts were obtained, and three-dimensional MRCP images were rendered by post processing.  COMPARISON:  CT on 02/13/2020  FINDINGS: Lower chest: Dependent bibasilar atelectasis.  Hepatobiliary: No masses visualized on this unenhanced exam. Postop changes are seen from recent cholecystectomy. A surgical drain is seen in the right upper quadrant. Multiloculated fluid collection with surrounding edema is seen in the right subhepatic space and along the course of the drainage catheter. This collection measures approximately 8.3 x 4.4 cm on image 32/3.  There is no evidence of biliary ductal dilatation, with common bile duct measuring 7 mm. No evidence of choledocholithiasis.  Pancreas: No mass or inflammatory process visualized on this unenhanced exam.  Spleen:  Within normal limits in size.  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 Normal appearance of left kidney. No evidence hydronephrosis.  Stomach/Bowel: Unremarkable.  Vascular/Lymphatic: No pathologically enlarged lymph nodes identified. No evidence of abdominal aortic aneurysm.  Other:  None.  Musculoskeletal:  No suspicious bone lesions identified.  IMPRESSION: 1. Postop changes from recent cholecystectomy. Multiloculated fluid collection with surrounding edema in the right subhepatic space, measuring 8.3 x 4.4 cm. This may  represent a postoperative hemorrhage or biloma. Consider nuclear medicine  hepatobiliary scan to exclude potential bile leak. 2. No evidence of biliary ductal dilatation. No evidence of choledocholithiasis.   Electronically Signed   By: Marlaine Hind M.D.   On: 02/17/2020 10:59  [place] 02/15/2020 CLINICAL DATA:  Right upper quadrant pain for 5 days  EXAM: ULTRASOUND ABDOMEN LIMITED RIGHT UPPER QUADRANT  COMPARISON:  02/13/2020.  FINDINGS: Gallbladder:  There are multiple small shadowing calculi layering dependently within the gallbladder, largest measuring 4 mm. There is no gallbladder wall thickening or pericholecystic fluid. Patient did display tenderness during performance of the ultrasound according to the sonographer.  Common bile duct:  Diameter: 7 mm  Liver:  Liver demonstrates normal echotexture. Mild intrahepatic biliary duct dilation. No focal liver abnormalities. Portal vein is patent on color Doppler imaging with normal direction of blood flow towards the liver.  Other: None.  IMPRESSION: 1. Cholelithiasis without evidence of acute cholecystitis. 2. Mild intrahepatic biliary duct dilation, though normal caliber is seen within the common bile duct. Choledocholithiasis was reported in the downstream common bile duct on prior CT, and the mild intrahepatic biliary duct dilation is unchanged since that exam.   Electronically Signed   By: Randa Ngo M.D.   On: 02/15/2020 16:15  CT 02/13/2020 CLINICAL DATA:  Epigastric abdominal pain over the last 3 weeks, worse on the right. Question diverticulitis. History of hiatal hernia, kidney cancer with right nephrectomy and gastric surgery.  EXAM: CT ABDOMEN AND PELVIS WITH CONTRAST  TECHNIQUE: Multidetector CT imaging of the abdomen and pelvis was performed using the standard protocol following bolus administration of intravenous contrast.  CONTRAST:  146mL OMNIPAQUE IOHEXOL 300 MG/ML  SOLN  COMPARISON:  Report from study 08/03/2014  FINDINGS: Lower chest:  Chronic basilar scarring. No active infiltrate. No effusion.  Hepatobiliary: No liver parenchymal lesion. Multiple small gallstones dependent in the gallbladder. Mildly prominent biliary ductal tree. The small stones are the sort that could pass into the common duct. One could question 1 or 2 small stones in the ampullary region, not definite.  Pancreas: Fatty atrophy of the pancreas.  No mass or pancreatitis.  Spleen: Normal  Adrenals/Urinary Tract: Adrenal glands are normal. Previous right nephrectomy. Left kidney is normal. Bladder is normal.  Stomach/Bowel: Diverticulosis of the left colon without evidence of diverticulitis. Right lateral abdominal wall hernia, possibly subsequent to the previous nephrectomy surgery. Hernia contains some colon, small bowel and mesentery, but there does not appear to be any obstruction or incarceration.  Vascular/Lymphatic: Aortic atherosclerosis. No aneurysm. IVC is normal. No retroperitoneal adenopathy.  Reproductive: Previous hysterectomy.  No pelvic mass.  Other: No free fluid or air.  Musculoskeletal: Ordinary lower lumbar degenerative changes.  IMPRESSION: Multiple small gallstones dependent within the gallbladder. Prominence of the biliary ductal tree. Question if 1 or 2 small stones have passed into the common duct and are present at the region of the ampulla. This could possibly be the acute symptomatic pathology.  Previous right nephrectomy. Right posterolateral abdominal wall hernia probably subsequent to that surgery. Hernia contains mesentery, small and large bowel, but there is no evidence of obstruction or incarceration.  Fatty atrophy of the pancreas.  Aortic atherosclerosis.  Diverticulosis without evidence of diverticulitis.   Electronically Signed   By: Nelson Chimes M.D.   On: 02/13/2020 00:51  Results for Carol Doyle, Carol Doyle (MRN [id number]) as of 02/18/2020 10:09  Ref. Range 02/12/2020  22:48 02/15/2020 15:13 02/16/2020 04:58 02/17/2020 05:31  Sodium Latest Ref Range: 135 -  145 mmol/L 138 135 140 137  Potassium Latest Ref Range: 3.5 - 5.1 mmol/L 3.7 3.8 3.8 5.1  Chloride Latest Ref Range: 98 - 111 mmol/L 102 100 105 104  CO2 Latest Ref Range: 22 - 32 mmol/L 27 24 27 26   Glucose Latest Ref Range: 70 - 99 mg/dL 137 (H) 132 (H) 107 (H) 133 (H)  Mean Plasma Glucose Latest Units: mg/dL  122.63    BUN Latest Ref Range: 8 - 23 mg/dL 11 10 9 9   Creatinine Latest Ref Range: 0.44 - 1.00 mg/dL 0.81 0.85 0.83 0.87  Calcium Latest Ref Range: 8.9 - 10.3 mg/dL 8.7 (L) 8.7 (L) 8.4 (L) 8.1 (L)  Anion gap Latest Ref Range: 5 - 15  9 11 8 7   Magnesium Latest Ref Range: 1.7 - 2.4 mg/dL    1.8  Alkaline Phosphatase Latest Ref Range: 38 - 126 U/L 157 (H) 187 (H) 158 (H) 126  Albumin Latest Ref Range: 3.5 - 5.0 g/dL 3.4 (L) 3.4 (L) 2.9 (L) 2.8 (L)  Amylase, Serum Latest Ref Range: 28 - 100 U/L   45   Lipase Latest Ref Range: 11 - 51 U/L 15 58 (H) 17   AST Latest Ref Range: 15 - 41 U/L 589 (H) 292 (H) 188 (H) 193 (H)  ALT Latest Ref Range: 0 - 44 U/L 651 (H) 492 (H) 354 (H) 324 (H)  Total Protein Latest Ref Range: 6.5 - 8.1 g/dL 7.4 7.5 6.5 6.3 (L)  Bilirubin, Direct Latest Ref Range: 0.0 - 0.2 mg/dL   0.7 (H)   Indirect Bilirubin Latest Ref Range: 0.3 - 0.9 mg/dL   0.9   Total Bilirubin Latest Ref Range: 0.3 - 1.2 mg/dL 4.2 (H) 4.1 (H) 1.6 (H) 1.2  GFR, Est Non African American Latest Ref Range: >60 mL/min >60 >60 >60 >60  GFR, Est African American Latest Ref Range: >60 mL/min >60 >60 >60 >60    Treatments: Laparoscopic cholecystectomy, aborted [facility] 02/16/2020   Discharge Exam: Blood pressure (!) 122/57, pulse 84, temperature 98.2 F (36.8 C), temperature source Oral, resp. rate 18, height 5\' 7"  (1.702 m), weight 117.8 kg, SpO2 93 %. General appearance: alert, cooperative and no distress Resp: normal work of breathing GI: soft, appropriately tender, bruising at port sites, dermabond clean dry  and intact, JP with bloody drainage, stripped Extremities: extremities normal, atraumatic, no cyanosis or edema  Disposition: Discharge disposition: 01-Home or Self Care       Discharge Instructions    Call MD for:  difficulty breathing, headache or visual disturbances   Complete by: As directed    Call MD for:  extreme fatigue   Complete by: As directed    Call MD for:  persistant dizziness or light-headedness   Complete by: As directed    Call MD for:  persistant nausea and vomiting   Complete by: As directed    Call MD for:  redness, tenderness, or signs of infection (pain, swelling, redness, odor or green/yellow discharge around incision site)   Complete by: As directed    Call MD for:  severe uncontrolled pain   Complete by: As directed    Call MD for:  temperature >100.4   Complete by: As directed    Increase activity slowly   Complete by: As directed      Allergies as of 02/17/2020   No Known Allergies     Medication List    STOP taking these medications   doxycycline 100 MG tablet Commonly known  as: VIBRA-TABS   HYDROcodone-acetaminophen 5-325 MG tablet Commonly known as: NORCO/VICODIN     TAKE these medications   aspirin 325 MG tablet Take 325 mg by mouth once.   atorvastatin 10 MG tablet Commonly known as: LIPITOR Take 10 mg by mouth daily.   budesonide-formoterol 160-4.5 MCG/ACT inhaler Commonly known as: SYMBICORT Inhale 1-2 puffs into the lungs daily as needed (for shortness of breath).   docusate sodium 100 MG capsule Commonly known as: Colace Take 1 capsule (100 mg total) by mouth 2 (two) times daily as needed for mild constipation.   omeprazole 40 MG capsule Commonly known as: PRILOSEC Take 40 mg by mouth daily.   ondansetron 4 MG disintegrating tablet Commonly known as: Zofran ODT Take 1 tablet (4 mg total) by mouth every 8 (eight) hours as needed. What changed: reasons to take this   ondansetron 4 MG tablet Commonly known as:  ZOFRAN Take 1 tablet (4 mg total) by mouth every 6 (six) hours as needed for nausea.   oxyCODONE 5 MG immediate release tablet Commonly known as: Oxy IR/ROXICODONE Take 1 tablet (5 mg total) by mouth every 4 (four) hours as needed for severe pain or breakthrough pain.   Turmeric 500 MG Caps Take 1,000 mg by mouth daily.      Follow-up Information    Virl Cagey, MD Follow up on 02/22/2020.   Specialty: General Surgery Why: Get labs before coming to clinic.  Contact information: [address] [place] [address] [phone]          Future Appointments  Date Time Provider [place]  02/22/2020 10:30 AM Virl Cagey, MD [facility] None    Signed: Virl Cagey 02/18/2020, 10:11 AM

## 2020-02-17 NOTE — Progress Notes (Signed)
AVS reprinted and discussed with patient. She has no further questions or concerns.

## 2020-02-17 NOTE — Progress Notes (Addendum)
Bilirubin is normal but transaminases remain elevated. Need to rule out choledocholithiasis before she goes home. Discussed with Dr. Constance Haw We will proceed with MRCP.  MRCP images reviewed with radiologist. No evidence of cholelithiasis. Agree with recommendations by Dr. Constance Haw. She will have LFTs repeated next week at the time of office visit with Dr. Constance Haw.

## 2020-02-17 NOTE — Discharge Instructions (Signed)
Discharge Laparoscopic Surgery Instructions:  Common Complaints: Right shoulder pain is common after laparoscopic surgery. This is secondary to the gas used in the surgery being trapped under the diaphragm.  Walk to help your body absorb the gas. This will improve in a few days. Pain at the port sites are common, especially the larger port sites. This will improve with time.  Some nausea is common and poor appetite. The main goal is to stay hydrated the first few days after surgery.   Diet/ Activity: Diet as tolerated. You may not have an appetite, but it is important to stay hydrated. Drink 64 ounces of water a day. Your appetite will return with time.  Shower per your regular routine daily.  Do not take hot showers. Take warm showers that are less than 10 minutes. NO hot tube right now.  Rest and listen to your body, but do not remain in bed all day.  Walk everyday for at least 15-20 minutes. Deep cough and move around every 1-2 hours in the first few days after surgery.  Do not lift > 10 lbs, perform excessive bending, pushing, pulling, squatting for 1-2 weeks after surgery.  Do not pick at the dermabond glue on your incision sites.  This glue film will remain in place for 1-2 weeks and will start to peel off.  Do not place lotions or balms on your incision unless instructed to specifically by Dr. Constance Haw.   JP Drain Instructions: Please keep the drain clean and dry. Replace the gauze/ tape around the drain if it gets dirty or wet/ saturated. Please do not mess with or cut the stitch that is keeping the drain in place. Secure the drain to your clothes so that it does not get dislodged.  You may want to wear a binder around your abdomen (girdle) at night for sleeping if you are worried about it getting pulled out.  Please record the output from the drain daily including the color and the amount in milliliters.  Please keep the drain covered with plastic and tape when you shower so that it  does not get wet.   Pain Expectations and Narcotics: -After surgery you will have pain associated with your incisions and this is normal. The pain is muscular and nerve pain, and will get better with time. -You are encouraged and expected to take non narcotic medications like tylenol and ibuprofen (when able) to treat pain as multiple modalities can aid with pain treatment. -Narcotics are only used when pain is severe or there is breakthrough pain. -You are not expected to have a pain score of 0 after surgery, as we cannot prevent pain. A pain score of 3-4 that allows you to be functional, move, walk, and tolerate some activity is the goal. The pain will continue to improve over the days after surgery and is dependent on your surgery. -Due to [place] law, we are only able to give a certain amount of pain medication to treat post operative pain, and we only give additional narcotics on a patient by patient basis.  -For most laparoscopic surgery, studies have shown that the majority of patients only need 10-15 narcotic pills, and for open surgeries most patients only need 15-20.   -Having appropriate expectations of pain and knowledge of pain management with non narcotics is important as we do not want anyone to become addicted to narcotic pain medication.  -Using ice packs in the first 48 hours and heating pads after 48 hours, wearing an abdominal  binder (when recommended), and using over the counter medications are all ways to help with pain management.   -Simple acts like meditation and mindfulness practices after surgery can also help with pain control and research has proven the benefit of these practices.  Medication: Take tylenol and ibuprofen as needed for pain control, alternating every 4-6 hours.  Example:  Tylenol 1000mg  @ 6am, 12noon, 6pm, 29midnight (Do not exceed 4000mg  of tylenol a day). Ibuprofen 800mg  @ 9am, 3pm, 9pm, 3am (Do not exceed 3600mg  of ibuprofen a day).  Take Roxicodone for  breakthrough pain every 4 hours.  Take Colace for constipation related to narcotic pain medication. If you do not have a bowel movement in 2 days, take Miralax over the counter.  Drink plenty of water to also prevent constipation.   Contact Information: If you have questions or concerns, please call our office, [phone], Monday- Thursday 8AM-5PM and Friday 8AM-12Noon.  If it is after hours or on the weekend, please call [facility] Main [facility], [phone], and ask to speak to the surgeon on call for Dr. Constance Haw at [facility].    Laparoscopic Cholecystectomy, Care After This sheet gives you information about how to care for yourself after your procedure. Your health care provider may also give you more specific instructions. If you have problems or questions, contact your health care provider. What can I expect after the procedure? After the procedure, it is common to have:  Pain at your incision sites. You will be given medicines to control this pain.  Mild nausea or vomiting.  Bloating and possible shoulder pain from the air-like gas that was used during the procedure. Follow these instructions at home: Incision care   Follow instructions from your health care provider about how to take care of your incisions. Make sure you: ? Wash your hands with soap and water before you change your bandage (dressing). If soap and water are not available, use hand sanitizer. ? Change your dressing as told by your health care provider. ? Leave stitches (sutures), skin glue, or adhesive strips in place. These skin closures may need to be in place for 2 weeks or longer. If adhesive strip edges start to loosen and curl up, you may trim the loose edges. Do not remove adhesive strips completely unless your health care provider tells you to do that.  Do not take baths, swim, or use a hot tub until your health care provider approves.   You may shower.  Check your incision area every day for signs of  infection. Check for: ? More redness, swelling, or pain. ? More fluid or blood. ? Warmth. ? Pus or a bad smell. Activity  Do not drive or use heavy machinery while taking prescription pain medicine.  Do not lift anything that is heavier than 10 lb (4.5 kg) until your health care provider approves.  Do not play contact sports until your health care provider approves.  Do not drive for 24 hours if you were given a medicine to help you relax (sedative).  Rest as needed. Do not return to work or school until your health care provider approves. General instructions  Take over-the-counter and prescription medicines only as told by your health care provider.  To prevent or treat constipation while you are taking prescription pain medicine, your health care provider may recommend that you: ? Drink enough fluid to keep your urine clear or pale yellow. ? Take over-the-counter or prescription medicines. ? Eat foods that are high in fiber, such  as fresh fruits and vegetables, whole grains, and beans. ? Limit foods that are high in fat and processed sugars, such as fried and sweet foods. Contact a health care provider if:  You develop a rash.  You have more redness, swelling, or pain around your incisions.  You have more fluid or blood coming from your incisions.  Your incisions feel warm to the touch.  You have pus or a bad smell coming from your incisions.  You have a fever.  One or more of your incisions breaks open. Get help right away if:  You have trouble breathing.  You have chest pain.  You have increasing pain in your shoulders.  You faint or feel dizzy when you stand.  You have severe pain in your abdomen.  You have nausea or vomiting that lasts for more than one day.  You have leg pain. This information is not intended to replace advice given to you by your health care provider. Make sure you discuss any questions you have with your health care provider. Document  Revised: 11/07/2017 Document Reviewed: 05/13/2016 Elsevier Patient Education  [place].

## 2020-02-17 NOTE — Progress Notes (Signed)
[facility]  Patient doing well this AM. Pain improved. JP with bloody drainage. MRCP without obstruction or stone, post operative fluid is blood and unlikely to be bile at this time.   JP to remain in place. Education for drain given.  T bili down.  Will do labs next Tuesday and see patient in clinic.  Labs ordered for CBC and CMP prior to clinic on Tuesday.   Can d/c home if tolerates diet. Discussed with Dr. Laural Golden and Dr. Carles Collet.  Future Appointments  Date Time Provider [place]  02/22/2020 10:30 AM Virl Cagey, MD [facility] None    Curlene Labrum, MD [facility] [address] [place], [place] [address] [phone] (office)

## 2020-02-18 ENCOUNTER — Telehealth: Payer: Self-pay | Admitting: General Surgery

## 2020-02-18 LAB — URINE CULTURE: Culture: >=100000 — AB

## 2020-02-18 LAB — SURGICAL PATHOLOGY

## 2020-02-18 MED ORDER — SULFAMETHOXAZOLE-TRIMETHOPRIM 800-160 MG PO TABS: 1.0000 | ORAL_TABLET | Freq: Two times a day (BID) | ORAL | 0 refills | Status: AC

## 2020-02-18 NOTE — Telephone Encounter (Signed)
[facility]  UTI on urine culture obtained by hospitalist. Patient says she was having pressure pain but no burning.   Urine culture is positive for E coli and sensitive to Bactrim. Rx sent to [facility]. Patient aware.  Curlene Labrum, MD [facility] [address] [place], [place] [address] [phone] (office)

## 2020-02-21 ENCOUNTER — Other Ambulatory Visit: Payer: Self-pay

## 2020-02-21 NOTE — Patient Outreach (Signed)
Red emmi:  Received red emmi for alert of:  Knowing who to call for changes in condition: -no                                                     Other questions:  Yes   Placed call to patient and reviewed reason for call. Patient reports she would call 911.  Reviewed with patient if she has a medical emergency to call 911 otherwise she should call her primary MD.  Reviewed who primary MD was and patient confirmed.   Reviewed additional questions and answered.  Patient had questions about her admission and states she has called patient experience.   PLAN: no additional needs identified.  Case closed.  Tomasa Rand, RN, BSN, CEN [facility] [employer] [phone]

## 2020-02-22 ENCOUNTER — Other Ambulatory Visit: Payer: Self-pay

## 2020-02-22 ENCOUNTER — Encounter: Payer: Self-pay | Admitting: General Surgery

## 2020-02-22 ENCOUNTER — Ambulatory Visit (INDEPENDENT_AMBULATORY_CARE_PROVIDER_SITE_OTHER): Payer: Self-pay | Admitting: General Surgery

## 2020-02-22 ENCOUNTER — Other Ambulatory Visit (HOSPITAL_COMMUNITY)
Admission: RE | Admit: 2020-02-22 | Discharge: 2020-02-22 | Disposition: A | Discharge status: Home / Self Care | Payer: Medicare Other | Source: Ambulatory Visit | Attending: General Surgery | Admitting: General Surgery

## 2020-02-22 VITALS — BP 130/72 | HR 82 | Temp 97.5°F | Resp 16 | Ht 67.0 in | Wt 255.0 lb

## 2020-02-22 DIAGNOSIS — R748 Abnormal levels of other serum enzymes: Secondary | ICD-10-CM | POA: Diagnosis not present

## 2020-02-22 DIAGNOSIS — K661 Hemoperitoneum: Secondary | ICD-10-CM | POA: Insufficient documentation

## 2020-02-22 DIAGNOSIS — R7989 Other specified abnormal findings of blood chemistry: Secondary | ICD-10-CM

## 2020-02-22 DIAGNOSIS — K810 Acute cholecystitis: Secondary | ICD-10-CM

## 2020-02-22 LAB — COMPREHENSIVE METABOLIC PANEL
ALT: 66 U/L — ABNORMAL HIGH (ref 0–44)
AST: 20 U/L (ref 15–41)
Albumin: 3.3 g/dL — ABNORMAL LOW (ref 3.5–5.0)
Alkaline Phosphatase: 88 U/L (ref 38–126)
Anion gap: 10 (ref 5–15)
BUN: 12 mg/dL (ref 8–23)
CO2: 24 mmol/L (ref 22–32)
Calcium: 8.8 mg/dL — ABNORMAL LOW (ref 8.9–10.3)
Chloride: 101 mmol/L (ref 98–111)
Creatinine, Ser: 1.03 mg/dL — ABNORMAL HIGH (ref 0.44–1.00)
GFR calc Af Amer: >60 mL/min (ref >60)
GFR calc non Af Amer: 53 mL/min — ABNORMAL LOW (ref >60)
Glucose, Bld: 134 mg/dL — ABNORMAL HIGH (ref 70–99)
Potassium: 4.3 mmol/L (ref 3.5–5.1)
Sodium: 135 mmol/L (ref 135–145)
Total Bilirubin: 1.1 mg/dL (ref 0.3–1.2)
Total Protein: 7.7 g/dL (ref 6.5–8.1)

## 2020-02-22 LAB — CBC WITH DIFFERENTIAL/PLATELET
Abs Immature Granulocytes: 0.03 10*3/uL (ref 0.00–0.07)
Basophils Absolute: 0.1 10*3/uL (ref 0.0–0.1)
Basophils Relative: 1 %
Eosinophils Absolute: 0.4 10*3/uL (ref 0.0–0.5)
Eosinophils Relative: 4 %
HCT: 42.4 % (ref 36.0–46.0)
Hemoglobin: 13.6 g/dL (ref 12.0–15.0)
Immature Granulocytes: 0 %
Lymphocytes Relative: 25 %
Lymphs Abs: 2.5 10*3/uL (ref 0.7–4.0)
MCH: 29.5 pg (ref 26.0–34.0)
MCHC: 32.1 g/dL (ref 30.0–36.0)
MCV: 92 fL (ref 80.0–100.0)
Monocytes Absolute: 1 10*3/uL (ref 0.1–1.0)
Monocytes Relative: 10 %
Neutro Abs: 5.9 10*3/uL (ref 1.7–7.7)
Neutrophils Relative %: 60 %
Platelets: 398 10*3/uL (ref 150–400)
RBC: 4.61 MIL/uL (ref 3.87–5.11)
RDW: 14.2 % (ref 11.5–15.5)
WBC: 9.8 10*3/uL (ref 4.0–10.5)
nRBC: 0 % (ref 0.0–0.2)

## 2020-02-22 MED ORDER — OXYCODONE HCL 5 MG PO TABS: 5.0000 mg | ORAL_TABLET | ORAL | 0 refills | Status: DC | PRN

## 2020-02-22 NOTE — Patient Instructions (Signed)
Diet and activity increase as tolerated. Try to stay hydrated. Get labs before coming to clinic next week.  Call with worsening pain, fever, or looking yellow. Go to ED if night or weekend.

## 2020-02-22 NOTE — Progress Notes (Signed)
[facility] Note   HPI:  79 y.o. Female presents to clinic for post-op follow-up evaluation after laparoscopic cholecystectomy and attempted IOC. JP placed to monitor for bile leak. JP output has been bloody and no bile in drain, the output has been < 20cc a day. She is stripping the drain.   Review of Systems:  No fevers or chills Pain in the RUQ Drain output bloody All other review of systems: otherwise negative   Vital Signs:  BP 130/72   Pulse 82   Temp (!) 97.5 F (36.4 C) (Oral)   Resp 16   Ht 5\' 7"  (1.702 m)   Wt 255 lb (115.7 kg)   SpO2 95%   BMI 39.94 kg/m    Physical Exam:  Physical Exam Vitals reviewed.  Constitutional:      Appearance: Normal appearance.  HENT:     Head: Normocephalic.     Comments: No jaundice or sclera icterus     Nose: Nose normal.     Mouth/Throat:     Mouth: Mucous membranes are moist.  Eyes:     Pupils: Pupils are equal, round, and reactive to light.  Cardiovascular:     Rate and Rhythm: Normal rate.  Pulmonary:     Effort: Pulmonary effort is normal.  Abdominal:     General: There is no distension.     Palpations: Abdomen is soft.     Tenderness: There is abdominal tenderness.     Comments: Port sites with bruising, JP with bloody drainage, dermabond in place and no erythema or drainage from sites   Musculoskeletal:        General: Normal range of motion.  Skin:    General: Skin is warm.  Neurological:     General: No focal deficit present.     Mental Status: She is alert and oriented to person, place, and time.  Psychiatric:        Mood and Affect: Mood normal.        Behavior: Behavior normal.     Laboratory studies:  Results for CARLYN, DESHLER (MRN [id number]) as of 02/22/2020 09:58  Ref. Range 02/22/2020 09:28  Sodium Latest Ref Range: 135 - 145 mmol/L 135  Potassium Latest Ref Range: 3.5 - 5.1 mmol/L 4.3  Chloride Latest Ref Range: 98 - 111 mmol/L 101  CO2 Latest Ref Range: 22 - 32  mmol/L 24  Glucose Latest Ref Range: 70 - 99 mg/dL 134 (H)  BUN Latest Ref Range: 8 - 23 mg/dL 12  Creatinine Latest Ref Range: 0.44 - 1.00 mg/dL 1.03 (H)  Calcium Latest Ref Range: 8.9 - 10.3 mg/dL 8.8 (L)  Anion gap Latest Ref Range: 5 - 15  10  Alkaline Phosphatase Latest Ref Range: 38 - 126 U/L 88  Albumin Latest Ref Range: 3.5 - 5.0 g/dL 3.3 (L)  AST Latest Ref Range: 15 - 41 U/L 20  ALT Latest Ref Range: 0 - 44 U/L 66 (H)  Total Protein Latest Ref Range: 6.5 - 8.1 g/dL 7.7  Total Bilirubin Latest Ref Range: 0.3 - 1.2 mg/dL 1.1  GFR, Est Non African American Latest Ref Range: >60 mL/min 53 (L)  GFR, Est African American Latest Ref Range: >60 mL/min >60  WBC Latest Ref Range: 4.0 - 10.5 K/uL 9.8  RBC Latest Ref Range: 3.87 - 5.11 MIL/uL 4.61  Hemoglobin Latest Ref Range: 12.0 - 15.0 g/dL 13.6  HCT Latest Ref Range: 36.0 - 46.0 % 42.4  MCV Latest Ref Range: 80.0 -  100.0 fL 92.0  MCH Latest Ref Range: 26.0 - 34.0 pg 29.5  MCHC Latest Ref Range: 30.0 - 36.0 g/dL 32.1  RDW Latest Ref Range: 11.5 - 15.5 % 14.2  Platelets Latest Ref Range: 150 - 400 K/uL 398  nRBC Latest Ref Range: 0.0 - 0.2 % 0.0  Neutrophils Latest Units: % 60  Lymphocytes Latest Units: % 25  Monocytes Relative Latest Units: % 10  Eosinophil Latest Units: % 4  Basophil Latest Units: % 1  Immature Granulocytes Latest Units: % 0  NEUT# Latest Ref Range: 1.7 - 7.7 K/uL 5.9  Lymphocyte # Latest Ref Range: 0.7 - 4.0 K/uL 2.5  Monocyte # Latest Ref Range: 0.1 - 1.0 K/uL 1.0  Eosinophils Absolute Latest Ref Range: 0.0 - 0.5 K/uL 0.4  Basophils Absolute Latest Ref Range: 0.0 - 0.1 K/uL 0.1  Abs Immature Granulocytes Latest Ref Range: 0.00 - 0.07 K/uL 0.03     Assessment:  79 y.o. yo Female s/p laparoscopic cholecystectomy, doing fair but with RUQ pain and tenderness, says she feels really sore. JP in place and no bile. LFTs normalized. Creatinine up slightly and she says her urine is yellow and had been brown preop.  She says she is drinking well. Nausea but not taking zofran. Warned that no bile leak evident now and will get out the drain, JP removed at bedside, gauze placed, but if continued pain, fevers, or looking jaundice will need to do imaging to ensure no bile leak or collection that this drain was not getting.  Plan:  Diet and activity increase as tolerated. Try to stay hydrated. Get labs before coming to clinic next week.  Call with worsening pain, fever, or looking yellow. Go to ED if night or weekend.  Roxicodone refilled, take tylenol too Zofran for nausea   Repeat labs for Creatinine next week. If not improved will do CT but hopefully pain getting better.   Future Appointments  Date Time Provider [place]  02/29/2020 10:45 AM Virl Cagey, MD [facility] None    All of the above recommendations were discussed with the patient and all of patient's questions were answered to her expressed satisfaction.  Curlene Labrum, MD [facility] [address] [place], [place] [address] [phone] (office)

## 2020-02-23 ENCOUNTER — Other Ambulatory Visit: Payer: Self-pay

## 2020-02-23 NOTE — Patient Outreach (Signed)
Red Emmi:  Reviewed red emmi: Sad and hopeless- yes Other questions--yes  Placed call to patient with no answer.  PLAN: will attempt again in 24 hours.  Tomasa Rand, RN, BSN, CEN [facility] [employer] [phone]

## 2020-02-24 ENCOUNTER — Telehealth: Payer: Self-pay

## 2020-02-24 ENCOUNTER — Other Ambulatory Visit: Payer: Self-pay

## 2020-02-24 NOTE — Patient Outreach (Signed)
Red Emmi: 2nd outreach attempt for red emmi- successful.  Reviewed with patient reason for call. Reviewed red emmi alert. Patient reports that it is discouraging that she is in pain. Reviewed with patient when to take her pain medications. Reviewed importance of taking pain medication before it is so bad that she can't stand the pain any longer.  Patient reports she does not like to take her pain medications and is in fear of not having enough. Reviewed with patient if she had follow up with surgeon and she states yes and drain was removed. Reports increase pain since drain was removed. Reports she think her abdomen might be swollen.  I reviewed with patient the importance of letting MD know. I offered to call MD and patient declined and states she will call primary MD and surgeon and get advisement.   Patient reports she does feel sad but states she will be alright. Offered to have [facility] social worker contact patient but she declined. Patient states she would be better if she wasn't in pain. Again reviewed with patient how to manage her pain and encouraged patient to call MD and report symptoms.  PLAN: patient declines need for [facility]. Declined this nurse calling MD. Decline social worker referral. Offered to call patient back in 2-3 days and she declined.  Will close case.  Tomasa Rand, RN, BSN, CEN [facility] [employer] [phone]

## 2020-02-24 NOTE — Telephone Encounter (Signed)
Left message for patient to return call to office- Dr.Bridges would like for you to wait to take the vaccine after her post op visit.

## 2020-02-28 ENCOUNTER — Inpatient Hospital Stay: Admit: 2020-02-28 | Payer: Medicare Other | Admitting: Orthopedic Surgery

## 2020-02-28 SURGERY — ARTHROPLASTY, KNEE, TOTAL
Anesthesia: Choice | Site: Knee | Laterality: Right

## 2020-02-29 ENCOUNTER — Other Ambulatory Visit: Payer: Self-pay

## 2020-02-29 ENCOUNTER — Encounter: Payer: Self-pay | Admitting: General Surgery

## 2020-02-29 ENCOUNTER — Ambulatory Visit (INDEPENDENT_AMBULATORY_CARE_PROVIDER_SITE_OTHER): Payer: Self-pay | Admitting: General Surgery

## 2020-02-29 ENCOUNTER — Other Ambulatory Visit (HOSPITAL_COMMUNITY)
Admission: RE | Admit: 2020-02-29 | Discharge: 2020-02-29 | Disposition: A | Discharge status: Home / Self Care | Payer: Medicare Other | Source: Ambulatory Visit | Attending: General Surgery | Admitting: General Surgery

## 2020-02-29 VITALS — BP 126/75 | HR 81 | Temp 97.5°F | Resp 16 | Ht 67.5 in | Wt 251.0 lb

## 2020-02-29 DIAGNOSIS — K810 Acute cholecystitis: Secondary | ICD-10-CM | POA: Diagnosis not present

## 2020-02-29 DIAGNOSIS — R7989 Other specified abnormal findings of blood chemistry: Secondary | ICD-10-CM

## 2020-02-29 LAB — COMPREHENSIVE METABOLIC PANEL
ALT: 17 U/L (ref 0–44)
AST: 16 U/L (ref 15–41)
Albumin: 3.3 g/dL — ABNORMAL LOW (ref 3.5–5.0)
Alkaline Phosphatase: 77 U/L (ref 38–126)
Anion gap: 10 (ref 5–15)
BUN: 13 mg/dL (ref 8–23)
CO2: 27 mmol/L (ref 22–32)
Calcium: 8.9 mg/dL (ref 8.9–10.3)
Chloride: 100 mmol/L (ref 98–111)
Creatinine, Ser: 0.88 mg/dL (ref 0.44–1.00)
GFR calc Af Amer: >60 mL/min (ref >60)
GFR calc non Af Amer: >60 mL/min (ref >60)
Glucose, Bld: 124 mg/dL — ABNORMAL HIGH (ref 70–99)
Potassium: 3.9 mmol/L (ref 3.5–5.1)
Sodium: 137 mmol/L (ref 135–145)
Total Bilirubin: 0.9 mg/dL (ref 0.3–1.2)
Total Protein: 7.6 g/dL (ref 6.5–8.1)

## 2020-02-29 NOTE — Progress Notes (Signed)
[facility] Note   HPI:  79 y.o. Female presents to clinic for post-op follow-up evaluation after JP drain removal last week. She says she is feeling better but is still sore. She says that she is eating better and having Bms. She says she just feels tired and sleeps often. She says she really has not felt great in the last 6 months.  Review of Systems:  Staying hydrated Tolerating more diet Sleepy/ weak Improving soreness  All other review of systems: otherwise negative   Vital Signs:  BP 126/75   Pulse 81   Temp (!) 97.5 F (36.4 C) (Oral)   Resp 16   Ht 5' 7.5" (1.715 m)   Wt 251 lb (113.9 kg)   SpO2 95%   BMI 38.73 kg/m    Physical Exam:  Physical Exam Vitals reviewed.  HENT:     Head: Normocephalic.     Nose: Nose normal.  Eyes:     Pupils: Pupils are equal, round, and reactive to light.  Cardiovascular:     Rate and Rhythm: Normal rate.  Pulmonary:     Effort: Pulmonary effort is normal.  Abdominal:     General: There is no distension.     Palpations: Abdomen is soft.     Tenderness: There is abdominal tenderness.     Comments: Port sites healing, evolving bruising around, mild tenderness at right lateral site from JP, no erythema or drainage from port sites  Musculoskeletal:        General: Normal range of motion.  Neurological:     Mental Status: She is alert.     Laboratory studies:  Results for JANUS, RAAD (MRN [id number]) as of 03/01/2020 10:59  Ref. Range 02/29/2020 10:05  COMPREHENSIVE METABOLIC PANEL Unknown Rpt (A)  Sodium Latest Ref Range: 135 - 145 mmol/L 137  Potassium Latest Ref Range: 3.5 - 5.1 mmol/L 3.9  Chloride Latest Ref Range: 98 - 111 mmol/L 100  CO2 Latest Ref Range: 22 - 32 mmol/L 27  Glucose Latest Ref Range: 70 - 99 mg/dL 124 (H)  BUN Latest Ref Range: 8 - 23 mg/dL 13  Creatinine Latest Ref Range: 0.44 - 1.00 mg/dL 0.88  Calcium Latest Ref Range: 8.9 - 10.3 mg/dL 8.9  Anion gap Latest Ref Range: 5  - 15  10  Alkaline Phosphatase Latest Ref Range: 38 - 126 U/L 77  Albumin Latest Ref Range: 3.5 - 5.0 g/dL 3.3 (L)  AST Latest Ref Range: 15 - 41 U/L 16  ALT Latest Ref Range: 0 - 44 U/L 17  Total Protein Latest Ref Range: 6.5 - 8.1 g/dL 7.6  Total Bilirubin Latest Ref Range: 0.3 - 1.2 mg/dL 0.9  GFR, Est Non African American Latest Ref Range: >60 mL/min >60  GFR, Est African American Latest Ref Range: >60 mL/min >60   Pathology: FINAL MICROSCOPIC DIAGNOSIS:   A. GALLBLADDER, CHOLECYSTECTOMY:  - Acute and chronic cholecystitis.  - Cholelithiasis.  - Benign reactive lymph node.   Assessment:  79 y.o. yo Female s/p laparoscopic cholecystectomy and JP drain placement to monitor for cystic duct leak. This has been removed and her LFTs has been normal. She had an MRCP after the surgery that confirmed no choledocholithiasis which we had been concerned about preoperatively. Creatinine had been up on the last labs and is now back to normal.   Plan:  - Diet and activity as tolerated - Follow up PRN - Call with questions or concerns   All of the  above recommendations were discussed with the patient and all of patient's  questions were answered to her expressed satisfaction.  Curlene Labrum, MD [facility] [address] [place], [place] [address] [phone] (office)

## 2020-03-01 ENCOUNTER — Encounter: Payer: Self-pay | Admitting: General Surgery

## 2020-03-01 DIAGNOSIS — Z23 Encounter for immunization: Secondary | ICD-10-CM | POA: Diagnosis not present

## 2020-04-21 DIAGNOSIS — Z6839 Body mass index (BMI) 39.0-39.9, adult: Secondary | ICD-10-CM | POA: Diagnosis not present

## 2020-04-21 DIAGNOSIS — E1165 Type 2 diabetes mellitus with hyperglycemia: Secondary | ICD-10-CM | POA: Diagnosis not present

## 2020-04-21 DIAGNOSIS — M1711 Unilateral primary osteoarthritis, right knee: Secondary | ICD-10-CM | POA: Diagnosis not present

## 2020-04-21 DIAGNOSIS — Z299 Encounter for prophylactic measures, unspecified: Secondary | ICD-10-CM | POA: Diagnosis not present

## 2020-04-21 DIAGNOSIS — R350 Frequency of micturition: Secondary | ICD-10-CM | POA: Diagnosis not present

## 2020-05-03 NOTE — Progress Notes (Signed)
DUE TO COVID-19 ONLY ONE VISITOR IS ALLOWED TO COME WITH YOU AND STAY IN THE WAITING ROOM ONLY DURING PRE OP AND PROCEDURE DAY OF SURGERY. THE 1 VISITOR MAY VISIT WITH YOU AFTER SURGERY IN YOUR PRIVATE ROOM DURING VISITING HOURS ONLY!  YOU NEED TO HAVE A COVID 19 TEST ON__6/3/21 _____ @__1020am_____ , THIS TEST MUST BE DONE BEFORE SURGERY, COME  Carol Doyle, Carol Doyle , [id number].  ([place]) ONCE YOUR COVID TEST IS COMPLETED, PLEASE BEGIN THE QUARANTINE INSTRUCTIONS AS OUTLINED IN YOUR HANDOUT.                Carol Doyle  05/03/2020   Your procedure is scheduled on:  05/15/20   Report to [facility] Main  Entrance   Report to admitting at  1100 AM     Call this number if you have problems the morning of surgery [phone]    Remember: Do not eat food   :After Midnight. BRUSH YOUR TEETH MORNING OF SURGERY AND RINSE YOUR MOUTH OUT, NO CHEWING GUM CANDY OR MINTS.     Take these medicines the morning of surgery with A SIP OF WATER:  Inhalers as usual and bring, Prilosec                                  You may not have any metal on your body including hair pins and              piercings  Do not wear jewelry, make-up, lotions, powders or perfumes, deodorant             Do not wear nail polish on your fingernails.  Do not shave  48 hours prior to surgery.               Do not bring valuables to the hospital. Carol Doyle.  Contacts, dentures or bridgework may not be worn into surgery.  Leave suitcase in the car. After surgery it may be brought to your room.     Patients discharged the day of surgery will not be allowed to drive home. IF YOU ARE HAVING SURGERY AND GOING HOME THE SAME DAY, YOU MUST HAVE AN ADULT TO DRIVE YOU HOME AND BE WITH YOU FOR 24 HOURS. YOU MAY GO HOME BY TAXI OR UBER OR ORTHERWISE, BUT AN ADULT MUST ACCOMPANY YOU HOME AND STAY WITH YOU FOR 24 HOURS.  Name and phone number  of your driver:                Please read over the following fact sheets you were given: _____________________________________________________________________             NO SOLID FOOD AFTER MIDNIGHT THE NIGHT PRIOR TO SURGERY. NOTHING BY MOUTH EXCEPT CLEAR LIQUIDS UNTIL   1025am . PLEASE FINISH ENSURE DRINK PER SURGEON ORDER  WHICH NEEDS TO BE COMPLETED AT 1025am.   CLEAR LIQUID DIET   Foods Allowed  Foods Excluded  Coffee and tea, regular and decaf                             liquids that you cannot  Plain Jell-O any favor except red or purple                                           see through such as: Fruit ices (not with fruit pulp)                                     milk, soups, orange juice  Iced Popsicles                                    All solid food Carbonated beverages, regular and diet                                    Cranberry, grape and apple juices Sports drinks like Gatorade Lightly seasoned clear broth or consume(fat free) Sugar, honey syrup  Sample Menu Breakfast                                Lunch                                     Supper Cranberry juice                    Beef broth                            Chicken broth Jell-O                                     Grape juice                           Apple juice Coffee or tea                        Jell-O                                      Popsicle                                                Coffee or tea                        Coffee or tea  _____________________________________________________________________  [facility] - Preparing for Surgery Before surgery, you can play an important role.  Because skin is not sterile, your skin  needs to be as free of germs as possible.  You can reduce the number of germs on your skin by washing with CHG (chlorahexidine gluconate) soap before surgery.  CHG is an antiseptic cleaner which  kills germs and bonds with the skin to continue killing germs even after washing. Please DO NOT use if you have an allergy to CHG or antibacterial soaps.  If your skin becomes reddened/irritated stop using the CHG and inform your nurse when you arrive at Short Stay. Do not shave (including legs and underarms) for at least 48 hours prior to the first CHG shower.  You may shave your face/neck. Please follow these instructions carefully:  1.  Shower with CHG Soap the night before surgery and the  morning of Surgery.  2.  If you choose to wash your hair, wash your hair first as usual with your  normal  shampoo.  3.  After you shampoo, rinse your hair and body thoroughly to remove the  shampoo.                           4.  Use CHG as you would any other liquid soap.  You can apply chg directly  to the skin and wash                       Gently with a scrungie or clean washcloth.  5.  Apply the CHG Soap to your body ONLY FROM THE NECK DOWN.   Do not use on face/ open                           Wound or open sores. Avoid contact with eyes, ears mouth and genitals (private parts).                       Wash face,  Genitals (private parts) with your normal soap.             6.  Wash thoroughly, paying special attention to the area where your surgery  will be performed.  7.  Thoroughly rinse your body with warm water from the neck down.  8.  DO NOT shower/wash with your normal soap after using and rinsing off  the CHG Soap.                9.  Pat yourself dry with a clean towel.            10.  Wear clean pajamas.            11.  Place clean sheets on your bed the night of your first shower and do not  sleep with pets. Day of Surgery : Do not apply any lotions/deodorants the morning of surgery.  Please wear clean clothes to the hospital/surgery center.  FAILURE TO FOLLOW THESE INSTRUCTIONS MAY RESULT IN THE CANCELLATION OF YOUR SURGERY PATIENT SIGNATURE_________________________________  NURSE  SIGNATURE__________________________________  ________________________________________________________________________   Carol Doyle  An incentive spirometer is a tool that can help keep your lungs clear and active. This tool measures how well you are filling your lungs with each breath. Taking long deep breaths may help reverse or decrease the chance of developing breathing (pulmonary) problems (especially infection) following:  A long period of time when you are unable to move or be active. BEFORE THE PROCEDURE   If the spirometer includes an indicator to  show your best effort, your nurse or respiratory therapist will set it to a desired goal.  If possible, sit up straight or lean slightly forward. Try not to slouch.  Hold the incentive spirometer in an upright position. INSTRUCTIONS FOR USE  1. Sit on the edge of your bed if possible, or sit up as far as you can in bed or on a chair. 2. Hold the incentive spirometer in an upright position. 3. Breathe out normally. 4. Place the mouthpiece in your mouth and seal your lips tightly around it. 5. Breathe in slowly and as deeply as possible, raising the piston or the ball toward the top of the column. 6. Hold your breath for 3-5 seconds or for as long as possible. Allow the piston or ball to fall to the bottom of the column. 7. Remove the mouthpiece from your mouth and breathe out normally. 8. Rest for a few seconds and repeat Steps 1 through 7 at least 10 times every 1-2 hours when you are awake. Take your time and take a few normal breaths between deep breaths. 9. The spirometer may include an indicator to show your best effort. Use the indicator as a goal to work toward during each repetition. 10. After each set of 10 deep breaths, practice coughing to be sure your lungs are clear. If you have an incision (the cut made at the time of surgery), support your incision when coughing by placing a pillow or rolled up towels firmly  against it. Once you are able to get out of bed, walk around indoors and cough well. You may stop using the incentive spirometer when instructed by your caregiver.  RISKS AND COMPLICATIONS  Take your time so you do not get dizzy or light-headed.  If you are in pain, you may need to take or ask for pain medication before doing incentive spirometry. It is harder to take a deep breath if you are having pain. AFTER USE  Rest and breathe slowly and easily.  It can be helpful to keep track of a log of your progress. Your caregiver can provide you with a simple table to help with this. If you are using the spirometer at home, follow these instructions: [place] IF:   You are having difficultly using the spirometer.  You have trouble using the spirometer as often as instructed.  Your pain medication is not giving enough relief while using the spirometer.  You develop fever of 100.5 F (38.1 C) or higher. SEEK IMMEDIATE MEDICAL CARE IF:   You cough up bloody sputum that had not been present before.  You develop fever of 102 F (38.9 C) or greater.  You develop worsening pain at or near the incision site. MAKE SURE YOU:   Understand these instructions.  Will watch your condition.  Will get help right away if you are not doing well or get worse. Document Released: 04/07/2007 Document Revised: 02/17/2012 Document Reviewed: 06/08/2007 ExitCare Patient Information 2014 ExitCare, [place].   ________________________________________________________________________  WHAT IS A BLOOD TRANSFUSION? Blood Transfusion Information  A transfusion is the replacement of blood or some of its parts. Blood is made up of multiple cells which provide different functions.  Red blood cells carry oxygen and are used for blood loss replacement.  White blood cells fight against infection.  Platelets control bleeding.  Plasma helps clot blood.  Other blood products are available for  specialized needs, such as hemophilia or other clotting disorders. BEFORE THE TRANSFUSION  Who gives  blood for transfusions?   Healthy volunteers who are fully evaluated to make sure their blood is safe. This is blood bank blood. Transfusion therapy is the safest it has ever been in the practice of medicine. Before blood is taken from a donor, a complete history is taken to make sure that person has no history of diseases nor engages in risky social behavior (examples are intravenous drug use or sexual activity with multiple partners). The donor's travel history is screened to minimize risk of transmitting infections, such as malaria. The donated blood is tested for signs of infectious diseases, such as HIV and hepatitis. The blood is then tested to be sure it is compatible with you in order to minimize the chance of a transfusion reaction. If you or a relative donates blood, this is often done in anticipation of surgery and is not appropriate for emergency situations. It takes many days to process the donated blood. RISKS AND COMPLICATIONS Although transfusion therapy is very safe and saves many lives, the main dangers of transfusion include:   Getting an infectious disease.  Developing a transfusion reaction. This is an allergic reaction to something in the blood you were given. Every precaution is taken to prevent this. The decision to have a blood transfusion has been considered carefully by your caregiver before blood is given. Blood is not given unless the benefits outweigh the risks. AFTER THE TRANSFUSION  Right after receiving a blood transfusion, you will usually feel much better and more energetic. This is especially true if your red blood cells have gotten low (anemic). The transfusion raises the level of the red blood cells which carry oxygen, and this usually causes an energy increase.  The nurse administering the transfusion will monitor you carefully for complications. HOME CARE  INSTRUCTIONS  No special instructions are needed after a transfusion. You may find your energy is better. Speak with your caregiver about any limitations on activity for underlying diseases you may have. SEEK MEDICAL CARE IF:   Your condition is not improving after your transfusion.  You develop redness or irritation at the intravenous (IV) site. SEEK IMMEDIATE MEDICAL CARE IF:  Any of the following symptoms occur over the next 12 hours:  Shaking chills.  You have a temperature by mouth above 102 F (38.9 C), not controlled by medicine.  Chest, back, or muscle pain.  People around you feel you are not acting correctly or are confused.  Shortness of breath or difficulty breathing.  Dizziness and fainting.  You get a rash or develop hives.  You have a decrease in urine output.  Your urine turns a dark color or changes to pink, red, or brown. Any of the following symptoms occur over the next 10 days:  You have a temperature by mouth above 102 F (38.9 C), not controlled by medicine.  Shortness of breath.  Weakness after normal activity.  The white part of the eye turns yellow (jaundice).  You have a decrease in the amount of urine or are urinating less often.  Your urine turns a dark color or changes to pink, red, or brown. Document Released: 11/22/2000 Document Revised: 02/17/2012 Document Reviewed: 07/11/2008 [facility] Patient Information 2014 [place], [place].  _______________________________________________________________________

## 2020-05-04 ENCOUNTER — Other Ambulatory Visit: Payer: Self-pay

## 2020-05-04 ENCOUNTER — Encounter (HOSPITAL_COMMUNITY)
Admission: RE | Admit: 2020-05-04 | Discharge: 2020-05-04 | Disposition: A | Discharge status: Home / Self Care | Payer: Medicare Other | Source: Ambulatory Visit | Attending: Internal Medicine | Admitting: Internal Medicine

## 2020-05-04 ENCOUNTER — Encounter (HOSPITAL_COMMUNITY): Payer: Self-pay

## 2020-05-11 ENCOUNTER — Encounter (HOSPITAL_COMMUNITY): Payer: Medicare Other

## 2020-05-11 ENCOUNTER — Other Ambulatory Visit (HOSPITAL_COMMUNITY): Payer: Medicare Other

## 2020-05-11 DIAGNOSIS — Z96652 Presence of left artificial knee joint: Secondary | ICD-10-CM | POA: Diagnosis not present

## 2020-05-11 DIAGNOSIS — M1711 Unilateral primary osteoarthritis, right knee: Secondary | ICD-10-CM | POA: Diagnosis not present

## 2020-05-15 ENCOUNTER — Encounter (HOSPITAL_COMMUNITY): Admission: RE | Payer: Self-pay | Source: Home / Self Care

## 2020-05-15 ENCOUNTER — Ambulatory Visit (HOSPITAL_COMMUNITY): Admission: RE | Admit: 2020-05-15 | Payer: Medicare Other | Source: Home / Self Care | Admitting: Orthopedic Surgery

## 2020-05-15 SURGERY — ARTHROPLASTY, KNEE, TOTAL
Anesthesia: Choice | Site: Knee | Laterality: Right

## 2020-07-07 DIAGNOSIS — I10 Essential (primary) hypertension: Secondary | ICD-10-CM | POA: Diagnosis not present

## 2020-07-07 DIAGNOSIS — E1165 Type 2 diabetes mellitus with hyperglycemia: Secondary | ICD-10-CM | POA: Diagnosis not present

## 2020-07-19 IMAGING — DX DG CHEST 2V
2 series · 2 of 2 positions shown · non-contrast
Comparison: 07/10/2019

CLINICAL DATA: Heiles the gastric pain

EXAM:
CHEST - 2 VIEW

[chest lat]
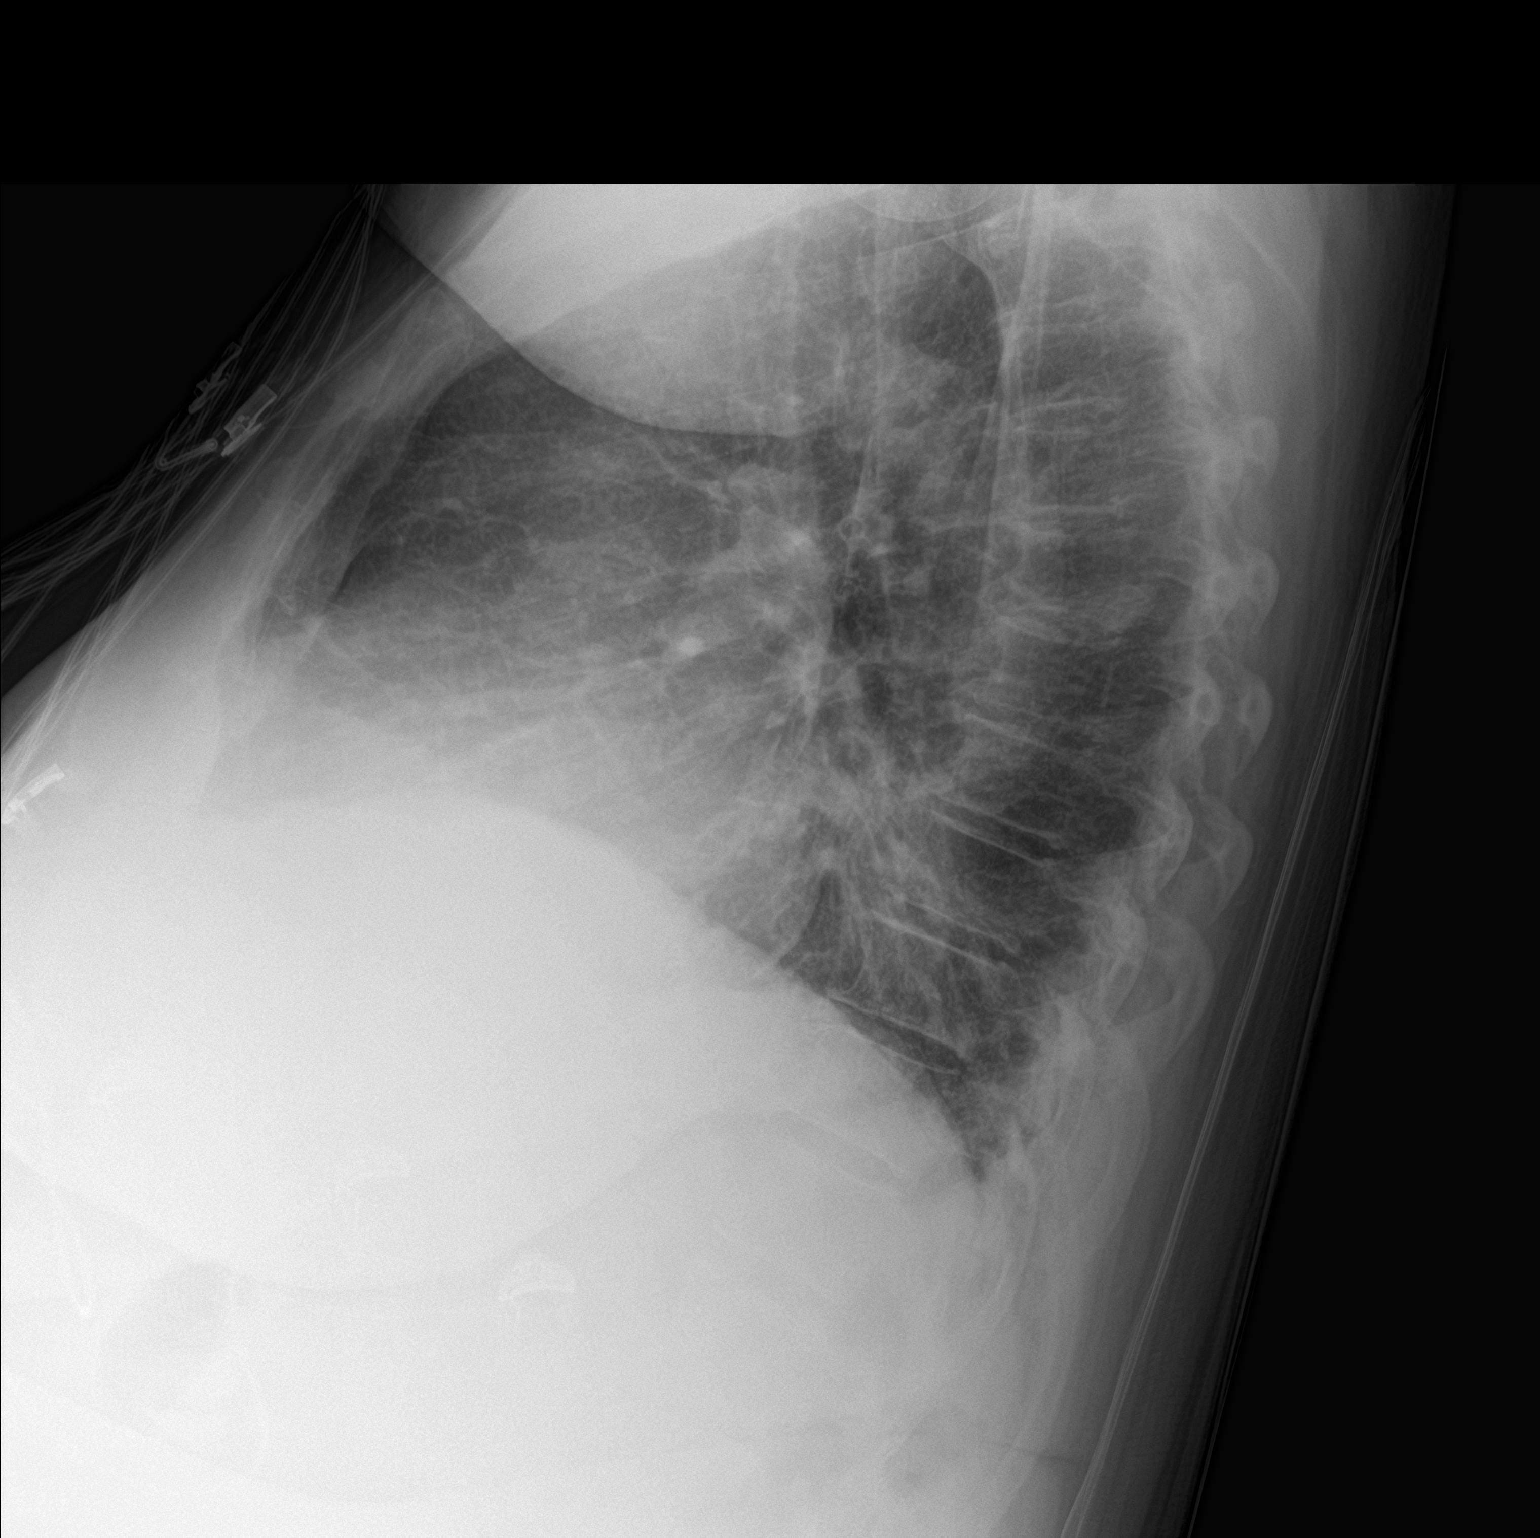

[chest ap]
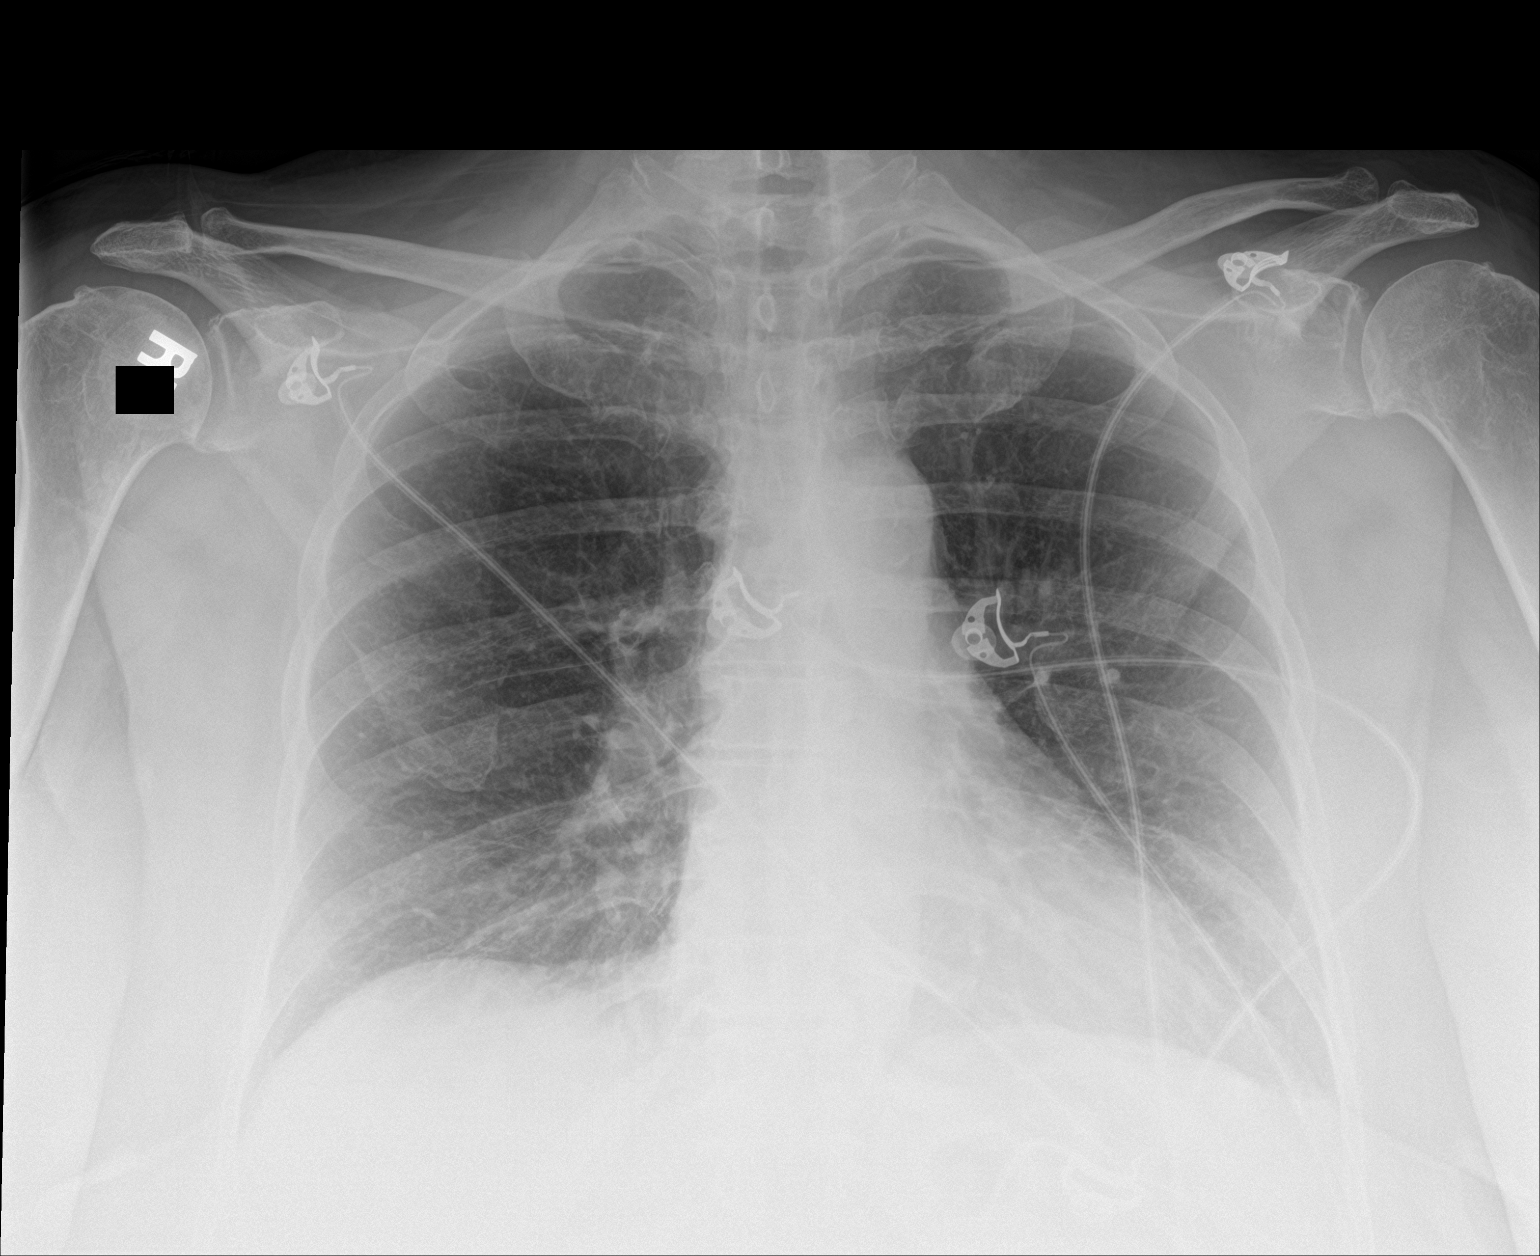

[2 of 2 positions shown; findings below may reference images not displayed]

FINDINGS: The heart size and mediastinal contours are within normal limits.
Both lungs are clear. The visualized skeletal structures are
unremarkable.
IMPRESSION: No active cardiopulmonary disease.

## 2020-08-08 DIAGNOSIS — E1165 Type 2 diabetes mellitus with hyperglycemia: Secondary | ICD-10-CM | POA: Diagnosis not present

## 2020-08-08 DIAGNOSIS — Z720 Tobacco use: Secondary | ICD-10-CM | POA: Diagnosis not present

## 2020-08-08 DIAGNOSIS — E7849 Other hyperlipidemia: Secondary | ICD-10-CM | POA: Diagnosis not present

## 2020-08-08 DIAGNOSIS — J45909 Unspecified asthma, uncomplicated: Secondary | ICD-10-CM | POA: Diagnosis not present

## 2020-09-07 DIAGNOSIS — E1165 Type 2 diabetes mellitus with hyperglycemia: Secondary | ICD-10-CM | POA: Diagnosis not present

## 2020-09-07 DIAGNOSIS — I10 Essential (primary) hypertension: Secondary | ICD-10-CM | POA: Diagnosis not present

## 2020-09-11 DIAGNOSIS — N3946 Mixed incontinence: Secondary | ICD-10-CM | POA: Diagnosis not present

## 2020-09-11 DIAGNOSIS — N952 Postmenopausal atrophic vaginitis: Secondary | ICD-10-CM | POA: Diagnosis not present

## 2020-09-11 DIAGNOSIS — Z905 Acquired absence of kidney: Secondary | ICD-10-CM | POA: Diagnosis not present

## 2020-09-11 DIAGNOSIS — N3281 Overactive bladder: Secondary | ICD-10-CM | POA: Diagnosis not present

## 2020-09-11 DIAGNOSIS — Q600 Renal agenesis, unilateral: Secondary | ICD-10-CM | POA: Diagnosis not present

## 2020-09-11 DIAGNOSIS — Z85528 Personal history of other malignant neoplasm of kidney: Secondary | ICD-10-CM | POA: Diagnosis not present

## 2020-09-11 DIAGNOSIS — Z87440 Personal history of urinary (tract) infections: Secondary | ICD-10-CM | POA: Diagnosis not present

## 2020-09-19 DIAGNOSIS — Z905 Acquired absence of kidney: Secondary | ICD-10-CM | POA: Diagnosis not present

## 2020-09-19 DIAGNOSIS — Z85528 Personal history of other malignant neoplasm of kidney: Secondary | ICD-10-CM | POA: Diagnosis not present

## 2020-10-06 DIAGNOSIS — E1165 Type 2 diabetes mellitus with hyperglycemia: Secondary | ICD-10-CM | POA: Diagnosis not present

## 2020-10-06 DIAGNOSIS — I10 Essential (primary) hypertension: Secondary | ICD-10-CM | POA: Diagnosis not present

## 2020-11-01 DIAGNOSIS — Z87440 Personal history of urinary (tract) infections: Secondary | ICD-10-CM | POA: Diagnosis not present

## 2020-11-01 DIAGNOSIS — Z905 Acquired absence of kidney: Secondary | ICD-10-CM | POA: Diagnosis not present

## 2020-11-01 DIAGNOSIS — N3946 Mixed incontinence: Secondary | ICD-10-CM | POA: Diagnosis not present

## 2020-11-01 DIAGNOSIS — N952 Postmenopausal atrophic vaginitis: Secondary | ICD-10-CM | POA: Diagnosis not present

## 2020-11-07 DIAGNOSIS — E7800 Pure hypercholesterolemia, unspecified: Secondary | ICD-10-CM | POA: Diagnosis not present

## 2020-11-07 DIAGNOSIS — K219 Gastro-esophageal reflux disease without esophagitis: Secondary | ICD-10-CM | POA: Diagnosis not present

## 2020-11-08 DIAGNOSIS — H10013 Acute follicular conjunctivitis, bilateral: Secondary | ICD-10-CM | POA: Diagnosis not present

## 2021-02-05 DIAGNOSIS — K219 Gastro-esophageal reflux disease without esophagitis: Secondary | ICD-10-CM | POA: Diagnosis not present

## 2021-02-05 DIAGNOSIS — E1165 Type 2 diabetes mellitus with hyperglycemia: Secondary | ICD-10-CM | POA: Diagnosis not present

## 2021-02-05 DIAGNOSIS — E7800 Pure hypercholesterolemia, unspecified: Secondary | ICD-10-CM | POA: Diagnosis not present

## 2021-03-07 DIAGNOSIS — I10 Essential (primary) hypertension: Secondary | ICD-10-CM | POA: Diagnosis not present

## 2021-03-07 DIAGNOSIS — K219 Gastro-esophageal reflux disease without esophagitis: Secondary | ICD-10-CM | POA: Diagnosis not present

## 2021-03-07 DIAGNOSIS — E7800 Pure hypercholesterolemia, unspecified: Secondary | ICD-10-CM | POA: Diagnosis not present

## 2021-04-07 DIAGNOSIS — E215 Disorder of parathyroid gland, unspecified: Secondary | ICD-10-CM | POA: Diagnosis not present

## 2021-04-07 DIAGNOSIS — M8580 Other specified disorders of bone density and structure, unspecified site: Secondary | ICD-10-CM | POA: Diagnosis not present

## 2021-04-07 DIAGNOSIS — I10 Essential (primary) hypertension: Secondary | ICD-10-CM | POA: Diagnosis not present

## 2021-05-02 DIAGNOSIS — Z299 Encounter for prophylactic measures, unspecified: Secondary | ICD-10-CM | POA: Diagnosis not present

## 2021-05-02 DIAGNOSIS — R32 Unspecified urinary incontinence: Secondary | ICD-10-CM | POA: Diagnosis not present

## 2021-05-02 DIAGNOSIS — Z87891 Personal history of nicotine dependence: Secondary | ICD-10-CM | POA: Diagnosis not present

## 2021-05-02 DIAGNOSIS — Z6839 Body mass index (BMI) 39.0-39.9, adult: Secondary | ICD-10-CM | POA: Diagnosis not present

## 2021-05-02 DIAGNOSIS — R030 Elevated blood-pressure reading, without diagnosis of hypertension: Secondary | ICD-10-CM | POA: Diagnosis not present

## 2021-05-02 DIAGNOSIS — R197 Diarrhea, unspecified: Secondary | ICD-10-CM | POA: Diagnosis not present

## 2021-05-02 DIAGNOSIS — E1165 Type 2 diabetes mellitus with hyperglycemia: Secondary | ICD-10-CM | POA: Diagnosis not present

## 2021-05-07 DIAGNOSIS — I10 Essential (primary) hypertension: Secondary | ICD-10-CM | POA: Diagnosis not present

## 2021-05-07 DIAGNOSIS — E215 Disorder of parathyroid gland, unspecified: Secondary | ICD-10-CM | POA: Diagnosis not present

## 2021-05-07 DIAGNOSIS — M8580 Other specified disorders of bone density and structure, unspecified site: Secondary | ICD-10-CM | POA: Diagnosis not present

## 2021-05-10 DIAGNOSIS — Z6839 Body mass index (BMI) 39.0-39.9, adult: Secondary | ICD-10-CM | POA: Diagnosis not present

## 2021-05-10 DIAGNOSIS — Z0000 Encounter for general adult medical examination without abnormal findings: Secondary | ICD-10-CM | POA: Diagnosis not present

## 2021-05-10 DIAGNOSIS — Z1331 Encounter for screening for depression: Secondary | ICD-10-CM | POA: Diagnosis not present

## 2021-05-10 DIAGNOSIS — Z7189 Other specified counseling: Secondary | ICD-10-CM | POA: Diagnosis not present

## 2021-05-10 DIAGNOSIS — E1165 Type 2 diabetes mellitus with hyperglycemia: Secondary | ICD-10-CM | POA: Diagnosis not present

## 2021-05-10 DIAGNOSIS — Z299 Encounter for prophylactic measures, unspecified: Secondary | ICD-10-CM | POA: Diagnosis not present

## 2021-05-10 DIAGNOSIS — M549 Dorsalgia, unspecified: Secondary | ICD-10-CM | POA: Diagnosis not present

## 2021-05-10 DIAGNOSIS — Z1339 Encounter for screening examination for other mental health and behavioral disorders: Secondary | ICD-10-CM | POA: Diagnosis not present

## 2021-05-14 DIAGNOSIS — E7800 Pure hypercholesterolemia, unspecified: Secondary | ICD-10-CM | POA: Diagnosis not present

## 2021-05-14 DIAGNOSIS — Z961 Presence of intraocular lens: Secondary | ICD-10-CM | POA: Diagnosis not present

## 2021-05-14 DIAGNOSIS — E559 Vitamin D deficiency, unspecified: Secondary | ICD-10-CM | POA: Diagnosis not present

## 2021-05-14 DIAGNOSIS — E119 Type 2 diabetes mellitus without complications: Secondary | ICD-10-CM | POA: Diagnosis not present

## 2021-05-14 DIAGNOSIS — H2511 Age-related nuclear cataract, right eye: Secondary | ICD-10-CM | POA: Diagnosis not present

## 2021-05-14 DIAGNOSIS — Z7984 Long term (current) use of oral hypoglycemic drugs: Secondary | ICD-10-CM | POA: Diagnosis not present

## 2021-05-14 DIAGNOSIS — R5383 Other fatigue: Secondary | ICD-10-CM | POA: Diagnosis not present

## 2021-05-14 DIAGNOSIS — Z79899 Other long term (current) drug therapy: Secondary | ICD-10-CM | POA: Diagnosis not present

## 2021-06-01 DIAGNOSIS — H2511 Age-related nuclear cataract, right eye: Secondary | ICD-10-CM | POA: Diagnosis not present

## 2021-06-01 DIAGNOSIS — Z961 Presence of intraocular lens: Secondary | ICD-10-CM | POA: Diagnosis not present

## 2021-06-01 DIAGNOSIS — Z01818 Encounter for other preprocedural examination: Secondary | ICD-10-CM | POA: Diagnosis not present

## 2021-06-07 DIAGNOSIS — M8580 Other specified disorders of bone density and structure, unspecified site: Secondary | ICD-10-CM | POA: Diagnosis not present

## 2021-06-07 DIAGNOSIS — I10 Essential (primary) hypertension: Secondary | ICD-10-CM | POA: Diagnosis not present

## 2021-06-07 DIAGNOSIS — E215 Disorder of parathyroid gland, unspecified: Secondary | ICD-10-CM | POA: Diagnosis not present

## 2021-07-08 DIAGNOSIS — E215 Disorder of parathyroid gland, unspecified: Secondary | ICD-10-CM | POA: Diagnosis not present

## 2021-07-08 DIAGNOSIS — M8580 Other specified disorders of bone density and structure, unspecified site: Secondary | ICD-10-CM | POA: Diagnosis not present

## 2021-07-08 DIAGNOSIS — I10 Essential (primary) hypertension: Secondary | ICD-10-CM | POA: Diagnosis not present

## 2021-08-09 ENCOUNTER — Other Ambulatory Visit: Payer: Self-pay | Admitting: Internal Medicine

## 2021-08-09 DIAGNOSIS — Z139 Encounter for screening, unspecified: Secondary | ICD-10-CM

## 2021-09-07 DIAGNOSIS — I10 Essential (primary) hypertension: Secondary | ICD-10-CM | POA: Diagnosis not present

## 2021-09-07 DIAGNOSIS — E215 Disorder of parathyroid gland, unspecified: Secondary | ICD-10-CM | POA: Diagnosis not present

## 2021-10-08 DIAGNOSIS — I10 Essential (primary) hypertension: Secondary | ICD-10-CM | POA: Diagnosis not present

## 2021-10-08 DIAGNOSIS — E215 Disorder of parathyroid gland, unspecified: Secondary | ICD-10-CM | POA: Diagnosis not present

## 2021-10-08 DIAGNOSIS — M8580 Other specified disorders of bone density and structure, unspecified site: Secondary | ICD-10-CM | POA: Diagnosis not present

## 2021-12-07 DIAGNOSIS — E7800 Pure hypercholesterolemia, unspecified: Secondary | ICD-10-CM | POA: Diagnosis not present

## 2021-12-07 DIAGNOSIS — K219 Gastro-esophageal reflux disease without esophagitis: Secondary | ICD-10-CM | POA: Diagnosis not present

## 2022-02-05 DIAGNOSIS — K219 Gastro-esophageal reflux disease without esophagitis: Secondary | ICD-10-CM | POA: Diagnosis not present

## 2022-02-05 DIAGNOSIS — E7800 Pure hypercholesterolemia, unspecified: Secondary | ICD-10-CM | POA: Diagnosis not present

## 2022-03-01 DIAGNOSIS — M545 Low back pain, unspecified: Secondary | ICD-10-CM | POA: Diagnosis not present

## 2022-03-01 DIAGNOSIS — Z299 Encounter for prophylactic measures, unspecified: Secondary | ICD-10-CM | POA: Diagnosis not present

## 2022-03-07 DIAGNOSIS — K219 Gastro-esophageal reflux disease without esophagitis: Secondary | ICD-10-CM | POA: Diagnosis not present

## 2022-03-07 DIAGNOSIS — E7800 Pure hypercholesterolemia, unspecified: Secondary | ICD-10-CM | POA: Diagnosis not present

## 2022-05-13 DIAGNOSIS — E559 Vitamin D deficiency, unspecified: Secondary | ICD-10-CM | POA: Diagnosis not present

## 2022-05-13 DIAGNOSIS — Z87891 Personal history of nicotine dependence: Secondary | ICD-10-CM | POA: Diagnosis not present

## 2022-05-13 DIAGNOSIS — Z1331 Encounter for screening for depression: Secondary | ICD-10-CM | POA: Diagnosis not present

## 2022-05-13 DIAGNOSIS — Z299 Encounter for prophylactic measures, unspecified: Secondary | ICD-10-CM | POA: Diagnosis not present

## 2022-05-13 DIAGNOSIS — Z6841 Body Mass Index (BMI) 40.0 and over, adult: Secondary | ICD-10-CM | POA: Diagnosis not present

## 2022-05-13 DIAGNOSIS — I10 Essential (primary) hypertension: Secondary | ICD-10-CM | POA: Diagnosis not present

## 2022-05-13 DIAGNOSIS — Z0000 Encounter for general adult medical examination without abnormal findings: Secondary | ICD-10-CM | POA: Diagnosis not present

## 2022-05-13 DIAGNOSIS — E1165 Type 2 diabetes mellitus with hyperglycemia: Secondary | ICD-10-CM | POA: Diagnosis not present

## 2022-05-13 DIAGNOSIS — E7800 Pure hypercholesterolemia, unspecified: Secondary | ICD-10-CM | POA: Diagnosis not present

## 2022-05-13 DIAGNOSIS — R5383 Other fatigue: Secondary | ICD-10-CM | POA: Diagnosis not present

## 2022-05-13 DIAGNOSIS — M549 Dorsalgia, unspecified: Secondary | ICD-10-CM | POA: Diagnosis not present

## 2022-05-13 DIAGNOSIS — Z79899 Other long term (current) drug therapy: Secondary | ICD-10-CM | POA: Diagnosis not present

## 2022-05-13 DIAGNOSIS — Z1339 Encounter for screening examination for other mental health and behavioral disorders: Secondary | ICD-10-CM | POA: Diagnosis not present

## 2022-05-13 DIAGNOSIS — Z7189 Other specified counseling: Secondary | ICD-10-CM | POA: Diagnosis not present

## 2022-06-03 DIAGNOSIS — I10 Essential (primary) hypertension: Secondary | ICD-10-CM | POA: Diagnosis not present

## 2022-06-03 DIAGNOSIS — Z299 Encounter for prophylactic measures, unspecified: Secondary | ICD-10-CM | POA: Diagnosis not present

## 2022-06-03 DIAGNOSIS — J45901 Unspecified asthma with (acute) exacerbation: Secondary | ICD-10-CM | POA: Diagnosis not present

## 2022-06-03 DIAGNOSIS — E2839 Other primary ovarian failure: Secondary | ICD-10-CM | POA: Diagnosis not present

## 2022-06-13 DIAGNOSIS — Z1211 Encounter for screening for malignant neoplasm of colon: Secondary | ICD-10-CM | POA: Diagnosis not present

## 2022-06-13 DIAGNOSIS — Z1212 Encounter for screening for malignant neoplasm of rectum: Secondary | ICD-10-CM | POA: Diagnosis not present

## 2022-07-03 DIAGNOSIS — J449 Chronic obstructive pulmonary disease, unspecified: Secondary | ICD-10-CM | POA: Diagnosis not present

## 2022-07-03 DIAGNOSIS — C641 Malignant neoplasm of right kidney, except renal pelvis: Secondary | ICD-10-CM | POA: Diagnosis not present

## 2022-07-03 DIAGNOSIS — D224 Melanocytic nevi of scalp and neck: Secondary | ICD-10-CM | POA: Diagnosis not present

## 2022-07-03 DIAGNOSIS — D229 Melanocytic nevi, unspecified: Secondary | ICD-10-CM | POA: Diagnosis not present

## 2022-07-03 DIAGNOSIS — I10 Essential (primary) hypertension: Secondary | ICD-10-CM | POA: Diagnosis not present

## 2022-07-03 DIAGNOSIS — Z299 Encounter for prophylactic measures, unspecified: Secondary | ICD-10-CM | POA: Diagnosis not present

## 2022-07-03 DIAGNOSIS — Z87891 Personal history of nicotine dependence: Secondary | ICD-10-CM | POA: Diagnosis not present

## 2022-07-05 DIAGNOSIS — D45 Polycythemia vera: Secondary | ICD-10-CM | POA: Diagnosis not present

## 2022-07-17 ENCOUNTER — Ambulatory Visit
Admission: RE | Admit: 2022-07-17 | Discharge: 2022-07-17 | Disposition: A | Discharge status: Home / Self Care | Payer: Medicare Other | Source: Ambulatory Visit | Attending: Internal Medicine | Admitting: Internal Medicine

## 2022-07-17 DIAGNOSIS — Z139 Encounter for screening, unspecified: Secondary | ICD-10-CM

## 2022-07-17 DIAGNOSIS — Z1231 Encounter for screening mammogram for malignant neoplasm of breast: Secondary | ICD-10-CM | POA: Diagnosis not present

## 2022-07-19 ENCOUNTER — Encounter (INDEPENDENT_AMBULATORY_CARE_PROVIDER_SITE_OTHER): Payer: Self-pay

## 2022-08-02 DIAGNOSIS — Z6841 Body Mass Index (BMI) 40.0 and over, adult: Secondary | ICD-10-CM | POA: Diagnosis not present

## 2022-08-02 DIAGNOSIS — Z299 Encounter for prophylactic measures, unspecified: Secondary | ICD-10-CM | POA: Diagnosis not present

## 2022-08-02 DIAGNOSIS — Z87891 Personal history of nicotine dependence: Secondary | ICD-10-CM | POA: Diagnosis not present

## 2022-08-02 DIAGNOSIS — I10 Essential (primary) hypertension: Secondary | ICD-10-CM | POA: Diagnosis not present

## 2022-08-02 DIAGNOSIS — H6090 Unspecified otitis externa, unspecified ear: Secondary | ICD-10-CM | POA: Diagnosis not present

## 2022-08-21 DIAGNOSIS — R0602 Shortness of breath: Secondary | ICD-10-CM | POA: Diagnosis not present

## 2022-08-21 DIAGNOSIS — K439 Ventral hernia without obstruction or gangrene: Secondary | ICD-10-CM | POA: Diagnosis not present

## 2022-08-21 DIAGNOSIS — K469 Unspecified abdominal hernia without obstruction or gangrene: Secondary | ICD-10-CM | POA: Diagnosis not present

## 2022-08-21 DIAGNOSIS — Z23 Encounter for immunization: Secondary | ICD-10-CM | POA: Diagnosis not present

## 2022-08-21 DIAGNOSIS — K5730 Diverticulosis of large intestine without perforation or abscess without bleeding: Secondary | ICD-10-CM | POA: Diagnosis not present

## 2022-08-21 DIAGNOSIS — I700 Atherosclerosis of aorta: Secondary | ICD-10-CM | POA: Diagnosis not present

## 2022-08-21 DIAGNOSIS — R109 Unspecified abdominal pain: Secondary | ICD-10-CM | POA: Diagnosis not present

## 2022-08-21 DIAGNOSIS — J449 Chronic obstructive pulmonary disease, unspecified: Secondary | ICD-10-CM | POA: Diagnosis not present

## 2022-08-21 DIAGNOSIS — Z905 Acquired absence of kidney: Secondary | ICD-10-CM | POA: Diagnosis not present

## 2022-08-21 DIAGNOSIS — E1165 Type 2 diabetes mellitus with hyperglycemia: Secondary | ICD-10-CM | POA: Diagnosis not present

## 2022-08-21 DIAGNOSIS — Z6841 Body Mass Index (BMI) 40.0 and over, adult: Secondary | ICD-10-CM | POA: Diagnosis not present

## 2022-08-21 DIAGNOSIS — Z299 Encounter for prophylactic measures, unspecified: Secondary | ICD-10-CM | POA: Diagnosis not present

## 2022-08-22 ENCOUNTER — Telehealth: Payer: Self-pay | Admitting: *Deleted

## 2022-08-22 NOTE — Telephone Encounter (Signed)
Received referral for ventral hernia from patient PCP, Dr. Manuella Ghazi. 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   Noted that patient was seen and had Lap Chole performed by Dr. Constance Haw previously.   Patient declined appointment with Dr. Constance Haw, requesting to see Dr. Arnoldo Morale. Appointment scheduled with Dr. Arnoldo Morale per patient request.

## 2022-09-02 ENCOUNTER — Other Ambulatory Visit: Payer: Self-pay | Admitting: *Deleted

## 2022-09-02 DIAGNOSIS — K469 Unspecified abdominal hernia without obstruction or gangrene: Secondary | ICD-10-CM

## 2022-09-10 ENCOUNTER — Ambulatory Visit: Payer: Medicare Other | Admitting: General Surgery

## 2022-10-07 DIAGNOSIS — R300 Dysuria: Secondary | ICD-10-CM | POA: Diagnosis not present

## 2022-10-07 DIAGNOSIS — N3000 Acute cystitis without hematuria: Secondary | ICD-10-CM | POA: Diagnosis not present

## 2022-10-25 DIAGNOSIS — Z299 Encounter for prophylactic measures, unspecified: Secondary | ICD-10-CM | POA: Diagnosis not present

## 2022-10-25 DIAGNOSIS — J441 Chronic obstructive pulmonary disease with (acute) exacerbation: Secondary | ICD-10-CM | POA: Diagnosis not present

## 2022-10-25 DIAGNOSIS — I10 Essential (primary) hypertension: Secondary | ICD-10-CM | POA: Diagnosis not present

## 2022-11-20 DIAGNOSIS — N390 Urinary tract infection, site not specified: Secondary | ICD-10-CM | POA: Diagnosis not present

## 2022-11-20 DIAGNOSIS — Z299 Encounter for prophylactic measures, unspecified: Secondary | ICD-10-CM | POA: Diagnosis not present

## 2022-11-20 DIAGNOSIS — I10 Essential (primary) hypertension: Secondary | ICD-10-CM | POA: Diagnosis not present

## 2022-11-20 DIAGNOSIS — E1165 Type 2 diabetes mellitus with hyperglycemia: Secondary | ICD-10-CM | POA: Diagnosis not present

## 2022-11-21 DIAGNOSIS — N390 Urinary tract infection, site not specified: Secondary | ICD-10-CM | POA: Diagnosis not present

## 2022-11-21 DIAGNOSIS — Z299 Encounter for prophylactic measures, unspecified: Secondary | ICD-10-CM | POA: Diagnosis not present

## 2022-11-21 DIAGNOSIS — M545 Low back pain, unspecified: Secondary | ICD-10-CM | POA: Diagnosis not present

## 2022-11-22 DIAGNOSIS — R5383 Other fatigue: Secondary | ICD-10-CM | POA: Diagnosis not present

## 2022-11-22 DIAGNOSIS — Z299 Encounter for prophylactic measures, unspecified: Secondary | ICD-10-CM | POA: Diagnosis not present

## 2022-11-22 DIAGNOSIS — N390 Urinary tract infection, site not specified: Secondary | ICD-10-CM | POA: Diagnosis not present

## 2022-11-26 DIAGNOSIS — R350 Frequency of micturition: Secondary | ICD-10-CM | POA: Diagnosis not present

## 2022-11-26 DIAGNOSIS — I10 Essential (primary) hypertension: Secondary | ICD-10-CM | POA: Diagnosis not present

## 2022-11-26 DIAGNOSIS — N3281 Overactive bladder: Secondary | ICD-10-CM | POA: Diagnosis not present

## 2022-11-26 DIAGNOSIS — Z299 Encounter for prophylactic measures, unspecified: Secondary | ICD-10-CM | POA: Diagnosis not present

## 2023-01-23 ENCOUNTER — Emergency Department (HOSPITAL_COMMUNITY): Payer: Medicare Other

## 2023-01-23 ENCOUNTER — Encounter (HOSPITAL_COMMUNITY): Payer: Self-pay

## 2023-01-23 ENCOUNTER — Emergency Department (HOSPITAL_COMMUNITY)
Admission: EM | Admit: 2023-01-23 | Discharge: 2023-01-24 | Disposition: A | Discharge status: Home / Self Care | Payer: Medicare Other | Attending: Emergency Medicine | Admitting: Emergency Medicine

## 2023-01-23 ENCOUNTER — Other Ambulatory Visit: Payer: Self-pay

## 2023-01-23 DIAGNOSIS — M160 Bilateral primary osteoarthritis of hip: Secondary | ICD-10-CM | POA: Diagnosis not present

## 2023-01-23 DIAGNOSIS — Z79899 Other long term (current) drug therapy: Secondary | ICD-10-CM | POA: Diagnosis not present

## 2023-01-23 DIAGNOSIS — X501XXA Overexertion from prolonged static or awkward postures, initial encounter: Secondary | ICD-10-CM | POA: Insufficient documentation

## 2023-01-23 DIAGNOSIS — M25552 Pain in left hip: Secondary | ICD-10-CM | POA: Insufficient documentation

## 2023-01-23 DIAGNOSIS — Y92810 Car as the place of occurrence of the external cause: Secondary | ICD-10-CM | POA: Insufficient documentation

## 2023-01-23 DIAGNOSIS — R2689 Other abnormalities of gait and mobility: Secondary | ICD-10-CM | POA: Diagnosis not present

## 2023-01-23 DIAGNOSIS — R52 Pain, unspecified: Secondary | ICD-10-CM

## 2023-01-23 DIAGNOSIS — T148XXA Other injury of unspecified body region, initial encounter: Secondary | ICD-10-CM

## 2023-01-23 DIAGNOSIS — M6281 Muscle weakness (generalized): Secondary | ICD-10-CM | POA: Diagnosis not present

## 2023-01-23 DIAGNOSIS — R609 Edema, unspecified: Secondary | ICD-10-CM | POA: Diagnosis not present

## 2023-01-23 MED ORDER — DIPHENHYDRAMINE HCL 25 MG PO TABS: 50.0000 mg | ORAL_TABLET | Freq: Every evening | ORAL | Status: DC | PRN

## 2023-01-23 MED ORDER — MOMETASONE FURO-FORMOTEROL FUM 200-5 MCG/ACT IN AERO
2.0000 | INHALATION_SPRAY | Freq: Two times a day (BID) | RESPIRATORY_TRACT | Status: DC
  Administered 2023-01-23 – 2023-01-24 (×2): 2 via RESPIRATORY_TRACT
  Filled 2023-01-23: qty 8.8

## 2023-01-23 MED ORDER — OXYCODONE-ACETAMINOPHEN 5-325 MG PO TABS
1.0000 | ORAL_TABLET | Freq: Once | ORAL | Status: AC
  Administered 2023-01-23: 1 via ORAL
  Filled 2023-01-23: qty 1

## 2023-01-23 MED ORDER — TURMERIC 500 MG PO CAPS: 1000.0000 mg | ORAL_CAPSULE | Freq: Every day | ORAL | Status: DC

## 2023-01-23 MED ORDER — MORPHINE SULFATE (PF) 4 MG/ML IV SOLN
4.0000 mg | Freq: Once | INTRAVENOUS | Status: AC
  Administered 2023-01-23: 4 mg via INTRAMUSCULAR
  Filled 2023-01-23: qty 1

## 2023-01-23 MED ORDER — ALBUTEROL SULFATE (2.5 MG/3ML) 0.083% IN NEBU: 2.5000 mg | INHALATION_SOLUTION | RESPIRATORY_TRACT | Status: DC | PRN

## 2023-01-23 MED ORDER — ATORVASTATIN CALCIUM 10 MG PO TABS
10.0000 mg | ORAL_TABLET | Freq: Every day | ORAL | Status: DC
  Administered 2023-01-24: 10 mg via ORAL
  Filled 2023-01-23: qty 1

## 2023-01-23 MED ORDER — PANTOPRAZOLE SODIUM 40 MG PO TBEC
40.0000 mg | DELAYED_RELEASE_TABLET | Freq: Every day | ORAL | Status: DC
  Administered 2023-01-24: 40 mg via ORAL
  Filled 2023-01-23 (×2): qty 1

## 2023-01-23 MED ORDER — OXYCODONE HCL 5 MG PO TABS
5.0000 mg | ORAL_TABLET | ORAL | Status: DC | PRN
  Administered 2023-01-23 – 2023-01-24 (×3): 5 mg via ORAL
  Filled 2023-01-23 (×3): qty 1

## 2023-01-23 MED ORDER — ATORVASTATIN CALCIUM 10 MG PO TABS: 10.0000 mg | ORAL_TABLET | Freq: Every day | ORAL | Status: DC

## 2023-01-23 MED ORDER — FLUOXETINE HCL 20 MG PO CAPS
20.0000 mg | ORAL_CAPSULE | Freq: Every day | ORAL | Status: DC
  Administered 2023-01-24: 20 mg via ORAL
  Filled 2023-01-23: qty 1

## 2023-01-23 MED ORDER — CHOLECALCIFEROL 125 MCG (5000 UT) PO CAPS
5000.0000 [IU] | ORAL_CAPSULE | Freq: Every day | ORAL | Status: DC
  Filled 2023-01-23 (×4): qty 1

## 2023-01-23 MED ORDER — FLUOXETINE HCL 20 MG PO CAPS: 20.0000 mg | ORAL_CAPSULE | Freq: Every day | ORAL | Status: DC

## 2023-01-23 MED ORDER — ESTROGENS, CONJUGATED 0.625 MG/GM VA CREA: 1.0000 | TOPICAL_CREAM | VAGINAL | Status: DC

## 2023-01-23 MED ORDER — IBUPROFEN 400 MG PO TABS
600.0000 mg | ORAL_TABLET | Freq: Four times a day (QID) | ORAL | Status: DC | PRN
  Filled 2023-01-23: qty 2

## 2023-01-23 MED ORDER — ALBUTEROL SULFATE HFA 108 (90 BASE) MCG/ACT IN AERS: 2.0000 | INHALATION_SPRAY | Freq: Four times a day (QID) | RESPIRATORY_TRACT | Status: DC | PRN

## 2023-01-23 NOTE — ED Provider Notes (Signed)
I provided a substantive portion of the care of this patient.  I personally made/approved the management plan for this patient and take responsibility for the patient management.      Pt presents today with left hip pain.  She felt a pop as she was getting out of her car and could not walk on her left leg.  She has likely muscle tear on MRI, but rads recommends additional imaging to see better.  She is unable to get up and walk even with a walker.  Due to this, we will keep her overnight and get additional mri imaging tomorrow morning.  PT consult and TOC consult placed for possible short term rehab.   Isla Pence, MD 01/23/23 2017

## 2023-01-23 NOTE — ED Triage Notes (Incomplete)
EMS called out to patients home. Patient states that she got out of her car and felt a pop and grinding sensation from her left hip. Patient was unable to get out of the car for about an hour before calling EMS. Patient told EMS pain is a 6/10 pain.

## 2023-01-23 NOTE — ED Triage Notes (Signed)
Reports was getting the car and felt a pop grinding in left hip. Took dog to groomer was ok while sitting in the car but attempted to get out and couldn't walk on left leg.

## 2023-01-23 NOTE — ED Provider Notes (Signed)
[facility] Provider Note   CSN: [id number] Arrival date & time: 01/23/23  1617     History  Chief Complaint  Patient presents with   Hip Pain    Carol Doyle is a 82 y.o. female who presents emergency department with chief complaint of hip pain.  Patient states that she was getting into her car when she heard a pop in her left hip.  She had some pain in her hip she took her dog to the groomer and sat in the car for about 2 hours during the grooming session.  She denies ever getting out of the car.  She states that when she got home to walk her dog inside and try to get out of the car she had severe pain trying to move the left hip and and as soon as she tried to bear weight was unable to move forward.  She called she pushed her life call button and was brought in by the emergency response team. She has no previous .   Hip Pain       Home Medications Prior to Admission medications   Medication Sig Start Date End Date Taking? Authorizing Provider  atorvastatin (LIPITOR) 10 MG tablet Take 10 mg by mouth daily. 01/11/20   [provider]  budesonide-formoterol (SYMBICORT) 160-4.5 MCG/ACT inhaler Inhale 1-2 puffs into the lungs in the morning and at bedtime.  11/09/19   [provider]  conjugated estrogens (PREMARIN) vaginal cream Place 1 Applicatorful vaginally 3 (three) times a week.    [provider]  diphenhydrAMINE (BENADRYL) 25 MG tablet Take 50 mg by mouth at bedtime.    [provider]  doxycycline (VIBRA-TABS) 100 MG tablet Take 100 mg by mouth 2 (two) times daily. 05/02/20   [provider]  FLUoxetine (PROZAC) 20 MG capsule Take 20 mg by mouth daily.    [provider]  HYDROcodone-acetaminophen (NORCO/VICODIN) 5-325 MG tablet Take 1 tablet by mouth every 4 (four) hours as needed for pain. 05/02/20   [provider]  omeprazole (PRILOSEC) 20 MG capsule Take 20  mg by mouth daily as needed (heartburn).    [provider]  ondansetron (ZOFRAN ODT) 4 MG disintegrating tablet Take 1 tablet (4 mg total) by mouth every 8 (eight) hours as needed. Patient not taking: Reported on 05/03/2020 02/13/20   Fredia Sorrow, MD  ondansetron (ZOFRAN) 4 MG tablet Take 1 tablet (4 mg total) by mouth every 6 (six) hours as needed for nausea. Patient not taking: Reported on 05/03/2020 02/17/20   Virl Cagey, MD  oxyCODONE (OXY IR/ROXICODONE) 5 MG immediate release tablet Take 1 tablet (5 mg total) by mouth every 4 (four) hours as needed for severe pain or breakthrough pain. Patient not taking: Reported on 05/03/2020 02/22/20   Virl Cagey, MD  Turmeric 500 MG CAPS Take 1,000 mg by mouth daily.    [provider]      Allergies    Patient has no known allergies.    Review of Systems   Review of Systems  Physical Exam Updated Vital Signs BP (!) 111/98 (BP Location: Right Arm)   Pulse 84   Temp 98.6 F (37 C) (Oral)   Resp 16   Ht 5' 7.5" (1.715 m)   Wt 113.9 kg   SpO2 96%   BMI 38.73 kg/m  Physical Exam Vitals and nursing note reviewed.  Constitutional:      General: She is  not in acute distress.    Appearance: She is well-developed. She is obese. She is not diaphoretic.  HENT:     Head: Normocephalic and atraumatic.     Right Ear: External ear normal.     Left Ear: External ear normal.     Nose: Nose normal.     Mouth/Throat:     Mouth: Mucous membranes are moist.  Eyes:     General: No scleral icterus.    Conjunctiva/sclera: Conjunctivae normal.  Cardiovascular:     Rate and Rhythm: Normal rate and regular rhythm.     Heart sounds: Normal heart sounds. No murmur heard.    No friction rub. No gallop.  Pulmonary:     Effort: Pulmonary effort is normal. No respiratory distress.     Breath sounds: Normal breath sounds.  Abdominal:     General: Bowel sounds are normal. There is no distension.     Palpations: Abdomen is  soft. There is no mass.     Tenderness: There is no abdominal tenderness. There is no guarding.  Musculoskeletal:     Cervical back: Normal range of motion.     Comments: Patient able to actively flex and internally and externally rotate the hip.  She complains that it is painful.  With standing patient has severe pain in the anterior part of the left hip along the TFL.  She is unable to stand on the hip  Skin:    General: Skin is warm and dry.  Neurological:     Mental Status: She is alert and oriented to person, place, and time.  Psychiatric:        Behavior: Behavior normal.     ED Results / Procedures / Treatments   Labs (all labs ordered are listed, but only abnormal results are displayed) Labs Reviewed - No data to display  EKG None  Radiology DG Hip Unilat W or Wo Pelvis 2-3 Views Right  Result Date: 01/23/2023 CLINICAL DATA:  Left hip pain after a popping sensation getting out of a car. EXAM: DG HIP (WITH OR WITHOUT PELVIS) 2-3V RIGHT COMPARISON:  None Available. FINDINGS: There is no evidence of hip fracture or dislocation. There is no evidence of arthropathy or other focal bone abnormality. IMPRESSION: Negative. Electronically Signed   By: Claudie Revering M.D.   On: 01/23/2023 16:54    Procedures Procedures    Medications Ordered in ED Medications  oxyCODONE-acetaminophen (PERCOCET/ROXICET) 5-325 MG per tablet 1 tablet (has no administration in time range)    ED Course/ Medical Decision Making/ A&P                             Medical Decision Making Amount and/or Complexity of Data Reviewed Radiology: ordered.  Risk Prescription drug management.  Patient with acute hip pain. Ddx includes lumbar radiculopathy, labral tear, occult fracture. MRI pending. F/u w PA robinson Suspect discharge with pain control        Final Clinical Impression(s) / ED Diagnoses Final diagnoses:  None    Rx / DC Orders ED Discharge Orders     None          Margarita Mail, PA-C 01/26/23 1325    Isla Pence, MD 01/28/23 [phone]

## 2023-01-23 NOTE — ED Notes (Signed)
Patient transported to MRI 

## 2023-01-24 ENCOUNTER — Emergency Department (HOSPITAL_COMMUNITY): Payer: Medicare Other

## 2023-01-24 DIAGNOSIS — M25552 Pain in left hip: Secondary | ICD-10-CM | POA: Diagnosis not present

## 2023-01-24 MED ORDER — VITAMIN D 25 MCG (1000 UNIT) PO TABS
1000.0000 [IU] | ORAL_TABLET | Freq: Every day | ORAL | Status: DC
  Administered 2023-01-24: 1000 [IU] via ORAL
  Filled 2023-01-24: qty 1

## 2023-01-24 MED ORDER — ACETAMINOPHEN 325 MG PO TABS
650.0000 mg | ORAL_TABLET | Freq: Four times a day (QID) | ORAL | Status: DC | PRN
  Administered 2023-01-24: 650 mg via ORAL
  Filled 2023-01-24 (×2): qty 2

## 2023-01-24 NOTE — Plan of Care (Signed)
  Problem: Acute Rehab PT Goals(only PT should resolve) Goal: Pt Will Go Supine/Side To Sit Outcome: Progressing Flowsheets (Taken 01/24/2023 1202) Pt will go Supine/Side to Sit:  with modified independence  with supervision Goal: Patient Will Transfer Sit To/From Stand Outcome: Progressing Flowsheets (Taken 01/24/2023 1202) Patient will transfer sit to/from stand:  with modified independence  with supervision Goal: Pt Will Transfer Bed To Chair/Chair To Bed Outcome: Progressing Flowsheets (Taken 01/24/2023 1202) Pt will Transfer Bed to Chair/Chair to Bed:  with modified independence  with supervision Goal: Pt Will Ambulate Outcome: Progressing Flowsheets (Taken 01/24/2023 1202) Pt will Ambulate:  50 feet  with modified independence  with supervision  with rolling walker   12:02 PM, 01/24/23 Lonell Grandchild, MPT Physical Therapist with [facility] [phone] office [phone] mobile phone

## 2023-01-24 NOTE — Progress Notes (Signed)
    Durable Medical Equipment  (From admission, onward)           Start     Ordered   01/24/23 1404  For home use only DME Walker rolling  Once       Question Answer Comment  Walker: With 5 Inch Wheels   Patient needs a walker to treat with the following condition Weakness      01/24/23 1406   01/24/23 1402  For home use only DME Bedside commode  Once       Comments: Patient requires bedside commode to be able to toilet as she is confined to one room and cannot safely navigate to the bathroom  Question:  Patient needs a bedside commode to treat with the following condition  Answer:  Weakness   01/24/23 1406   01/24/23 1359  For home use only DME Hospital bed  Once       Comments: Patient suffers from chronic hip pain which is caused by osteoarthritis.  Hospital bed will alleviate pain by allowing hips to be repositioned  in ways not feasible with a normal bed.  Pain episodes frequently require frequent and immediate changes in body position which cannot be achieved with a normal bed.  Question Answer Comment  Length of Need Lifetime   Patient has (list medical condition): left hip pain, osteo arthritis   The above medical condition requires: Patient requires the ability to reposition frequently   Bed type Semi-electric   Support Surface: Gel Overlay      01/24/23 1406   01/24/23 0000  For home use only DME Hospital bed       Question Answer Comment  Length of Need Lifetime   Patient has (list medical condition): difficulty with ambulation, muscle tear   Bed type Semi-electric      01/24/23 1207

## 2023-01-24 NOTE — Evaluation (Signed)
Physical Therapy Evaluation Patient Details Name: Carol Doyle MRN: [id number] DOB: 1941-09-09 Today's Date: 01/24/2023  History of Present Illness  Carol Doyle is a 82 y/o female, presents today with left hip pain.  She felt a pop as she was getting out of her car and could not walk on her left leg.  She has likely muscle tear on MRI, but rads recommends additional imaging to see better.  She is unable to get up and walk even with a walker.  Due to this, we will keep her overnight and get additional mri imaging tomorrow morning.  PT consult and TOC consult placed for possible short term rehab.   Clinical Impression  Patient demonstrates slow labored movement for sitting up at bedside with fair/good return for moving LLE, required verbal cues for proper hand placement during transfer to chair with fair/good carryover, able to ambulate in room with slow labored cadence without loss of balance, but limited due to increasing left hip pain.  Patient tolerated sitting up in chair after therapy - nurse notified.  Patient will benefit from continued skilled physical therapy in hospital and recommended venue below to increase strength, balance, endurance for safe ADLs and gait.          Recommendations for follow up therapy are one component of a multi-disciplinary discharge planning process, led by the attending physician.  Recommendations may be updated based on patient status, additional functional criteria and insurance authorization.  Follow Up Recommendations Home health PT      Assistance Recommended at Discharge Set up Supervision/Assistance  Patient can return home with the following  A little help with walking and/or transfers;A little help with bathing/dressing/bathroom;Assistance with cooking/housework;Help with stairs or ramp for entrance    Equipment Recommendations Rolling walker (2 wheels);BSC/3in1;Hospital bed  Recommendations for Other Services        Functional Status Assessment Patient has had a recent decline in their functional status and demonstrates the ability to make significant improvements in function in a reasonable and predictable amount of time.     Precautions / Restrictions Precautions Precautions: Fall Restrictions Weight Bearing Restrictions: No      Mobility  Bed Mobility Overal bed mobility: Needs Assistance Bed Mobility: Supine to Sit     Supine to sit: Supervision     General bed mobility comments: increased time, labored movement, increased pain left hip    Transfers Overall transfer level: Needs assistance Equipment used: Rolling walker (2 wheels) Transfers: Sit to/from Stand, Bed to chair/wheelchair/BSC Sit to Stand: Supervision, Min guard   Step pivot transfers: Min guard       General transfer comment: slow labored movement, verbal cues for proper hand placement during stand to sitting    Ambulation/Gait Ambulation/Gait assistance: Min guard Gait Distance (Feet): 20 Feet Assistive device: Rolling walker (2 wheels) Gait Pattern/deviations: Decreased step length - right, Decreased step length - left, Decreased stride length Gait velocity: decreased     General Gait Details: slow labored cadence without loss of balance, limited mostly due to increasing left hip pain  Stairs            Wheelchair Mobility    Modified Rankin (Stroke Patients Only)       Balance Overall balance assessment: Needs assistance Sitting-balance support: Feet unsupported, No upper extremity supported Sitting balance-Leahy Scale: Fair Sitting balance - Comments: fair/good seated at EOB   Standing balance support: During functional activity, Bilateral upper extremity supported Standing balance-Leahy Scale: Fair Standing balance comment: fair/good using  RW                             Pertinent Vitals/Pain Pain Assessment Pain Assessment: 0-10 Pain Score: 3  Pain Location: left  hip Pain Descriptors / Indicators: Discomfort, Sore Pain Intervention(s): Limited activity within patient's tolerance, Premedicated before session, Repositioned, Monitored during session    [place] expects to be discharged to:: Private residence Living Arrangements: Alone Available Help at Discharge: Family;Available 24 hours/day Type of Home: House Home Access: Stairs to enter Entrance Stairs-Rails: Left Entrance Stairs-Number of Steps: 3 Alternate Level Stairs-Number of Steps: 13-14 to go upstairs, 14 steps to basement, siderail on left side Home Layout: Two level Home Equipment: Cane - single point      Prior Function Prior Level of Function : Independent/Modified Independent;Driving             Mobility Comments: Hydrographic surveyor, drives ADLs Comments: Independent     Hand Dominance        Extremity/Trunk Assessment   Upper Extremity Assessment Upper Extremity Assessment: Overall WFL for tasks assessed    Lower Extremity Assessment Lower Extremity Assessment: Generalized weakness;LLE deficits/detail LLE Deficits / Details: grossly -4/5 LLE: Unable to fully assess due to pain LLE Sensation: WNL LLE Coordination: WNL    Cervical / Trunk Assessment Cervical / Trunk Assessment: Normal  Communication   Communication: No difficulties  Cognition Arousal/Alertness: Awake/alert Behavior During Therapy: WFL for tasks assessed/performed Overall Cognitive Status: Within Functional Limits for tasks assessed                                          General Comments      Exercises     Assessment/Plan    PT Assessment Patient needs continued PT services  PT Problem List Decreased strength;Decreased activity tolerance;Decreased balance;Decreased mobility       PT Treatment Interventions DME instruction;Gait training;Stair training;Functional mobility training;Therapeutic activities;Therapeutic exercise;Balance  training;Patient/family education    PT Goals (Current goals can be found in the Care Plan section)  Acute Rehab PT Goals Patient Stated Goal: return home with family to assist PT Goal Formulation: With patient Time For Goal Achievement: 01/31/23 Potential to Achieve Goals: Good    Frequency Min 2X/week     Co-evaluation               AM-PAC PT "6 Clicks" Mobility  Outcome Measure Help needed turning from your back to your side while in a flat bed without using bedrails?: A Little Help needed moving from lying on your back to sitting on the side of a flat bed without using bedrails?: A Little Help needed moving to and from a bed to a chair (including a wheelchair)?: A Little Help needed standing up from a chair using your arms (e.g., wheelchair or bedside chair)?: A Little Help needed to walk in hospital room?: A Little Help needed climbing 3-5 steps with a railing? : A Lot 6 Click Score: 17    End of Session   Activity Tolerance: Patient tolerated treatment well;Patient limited by fatigue;Patient limited by pain Patient left: in chair;with call bell/phone within reach Nurse Communication: Mobility status PT Visit Diagnosis: Unsteadiness on feet (R26.81);Other abnormalities of gait and mobility (R26.89);Muscle weakness (generalized) (M62.81);Pain Pain - Right/Left: Left Pain - part of body: Hip    Time: [id number] PT Time Calculation (  min) (ACUTE ONLY): 20 min   Charges:   PT Evaluation $PT Eval Moderate Complexity: 1 Mod PT Treatments $Therapeutic Activity: 8-22 mins        12:00 PM, 01/24/23 Lonell Grandchild, MPT Physical Therapist with [facility] 336 [phone] office [phone] mobile phone

## 2023-01-24 NOTE — ED Notes (Signed)
Pt called and reported that equipment needed for home will be delivered. RN called daughter. Daughter stated she would "call someone" when everything is set up and she can come and get the pt. Daughter stated she is very overwhelmed with "all of this" and she is "doing the best she can."

## 2023-01-24 NOTE — Progress Notes (Signed)
Additional [place] contacted:  [facility]- declined, [facility]- accepted, however services will begin in the beginning of next week, as agency reports they have to obtain authorization for services first.  Agency info added to AVS.

## 2023-01-24 NOTE — Progress Notes (Addendum)
Awaiting PT.   Addend @ 11:56 AM  Spoke with pt's granddaughter who is agreeable with d/c plan. Informed of PT's recommendation for HHPT, and DME (BSC, RW and hospital bed).   TOC to contact DME agency for DME and attempt to secure HHPT with [facility] agency.   Addend @ 12:51 PM No HH agency has been able to accept thus far.   [employer]. [facility]- Out of coverage area [facility]- cannot staff area.  [place] of coverage area.  [place]   Addend @ 1:46 PM [facility]- Declined.  [facility]: Insurance OON.  [facility]: Engineer, maintenance.

## 2023-01-24 NOTE — ED Provider Notes (Signed)
Patient will be discharged home.  Her oxygen has been set up at home   Milton Ferguson, MD 01/24/23 1836

## 2023-01-24 NOTE — ED Notes (Signed)
Pt up to bedside commode with minimal assistance. Pt able to clean self without assistance. Pt tolerated well. Pt asking for tylenol instead of ibuprofen, MD notified.

## 2023-01-24 NOTE — Discharge Instructions (Signed)
Follow-up with your family doctor next week for recheck. 

## 2023-01-28 ENCOUNTER — Telehealth: Payer: Self-pay

## 2023-01-28 NOTE — Telephone Encounter (Signed)
        Patient  visited [place] on 2/16    Telephone encounter attempt :  1st  A HIPAA compliant voice message was left requesting a return call.  Instructed patient to call back     [facility] [phone] [address], [place], [place] [address] Phone: [phone] Email: [email or web address]

## 2023-01-29 ENCOUNTER — Telehealth: Payer: Self-pay

## 2023-01-29 NOTE — Telephone Encounter (Signed)
     Patient  visit on 2/16  at [place] you been able to follow up with your primary care physician? Yes   The patient was or was not able to obtain any needed medicine or equipment. Yes   Are there diet recommendations that you are having difficulty following? Na   Patient expresses understanding of discharge instructions and education provided has no other needs at this time.  Yes      [facility] [phone] [address], [place], [place] [address] Phone: [phone] Email: [email or web address]

## 2023-10-21 ENCOUNTER — Ambulatory Visit
Admission: RE | Admit: 2023-10-21 | Discharge: 2023-10-21 | Disposition: A | Discharge status: Home / Self Care | Payer: Federal, State, Local not specified - PPO | Source: Ambulatory Visit | Attending: Internal Medicine | Admitting: Internal Medicine

## 2023-10-21 ENCOUNTER — Other Ambulatory Visit: Payer: Self-pay | Admitting: Internal Medicine

## 2023-10-21 DIAGNOSIS — Z1231 Encounter for screening mammogram for malignant neoplasm of breast: Secondary | ICD-10-CM

## 2024-02-16 ENCOUNTER — Ambulatory Visit: Attending: Internal Medicine | Admitting: Internal Medicine

## 2024-02-16 ENCOUNTER — Encounter: Payer: Self-pay | Admitting: Internal Medicine

## 2024-02-16 VITALS — BP 116/68 | HR 73 | Ht 67.0 in | Wt 242.0 lb

## 2024-02-16 DIAGNOSIS — E66812 Obesity, class 2: Secondary | ICD-10-CM | POA: Diagnosis not present

## 2024-02-16 DIAGNOSIS — R0902 Hypoxemia: Secondary | ICD-10-CM

## 2024-02-16 DIAGNOSIS — R002 Palpitations: Secondary | ICD-10-CM | POA: Diagnosis not present

## 2024-02-16 DIAGNOSIS — Z6837 Body mass index (BMI) 37.0-37.9, adult: Secondary | ICD-10-CM

## 2024-02-16 NOTE — Patient Instructions (Signed)
 Medication Instructions:  NO CHANGES  *If you need a refill on your cardiac medications before your next appointment, please call your pharmacy*   Testing/Procedures: Continue wearing ZIO as ordered Please have your PCP send [place] your monitor report Fax: [phone]   Follow-Up: At [facility], you and your health needs are our priority.  As part of our continuing mission to provide you with exceptional heart care, we have created designated Provider Care Teams.  These Care Teams include your primary Cardiologist (physician) and Advanced Practice Providers (APPs -  Physician Assistants and Nurse Practitioners) who all work together to provide you with the care you need, when you need it.  We recommend signing up for the patient portal called "MyChart".  Sign up information is provided on this After Visit Summary.  MyChart is used to connect with patients for Virtual Visits (Telemedicine).  Patients are able to view lab/test results, encounter notes, upcoming appointments, etc.  Non-urgent messages can be sent to your provider as well.   To learn more about what you can do with MyChart, go to [email or web address].    Your next appointment:    6 months with Dr. Rennis Golden  Other Instructions   1st Floor: - Lobby - Registration  - Pharmacy  - Lab - Cafe  2nd Floor: - PV Lab - Diagnostic Testing (echo, CT, nuclear med)  3rd Floor: - Vacant  4th Floor: - TCTS (cardiothoracic surgery) - AFib Clinic - Structural Heart Clinic - Vascular Surgery  - Vascular Ultrasound  5th Floor: - HeartCare Cardiology (general and EP) - Clinical Pharmacy for coumadin, hypertension, lipid, weight-loss medications, and med management appointments    Valet parking services will be available as well.

## 2024-02-16 NOTE — Progress Notes (Unsigned)
 OFFICE CONSULT NOTE  Chief Complaint:  Palpitations  Primary Care Physician: Carol Peri, MD  HPI:  Carol Doyle is a 83 y.o. female who is being seen today for the evaluation of palpitations at the request of Carol Peri, MD. a pleasant 83 year old female with a history of morbid obesity, emphysema by imaging on chronic oxygen therapy, anxiety, and by report aortic atherosclerosis noted on a recent CT scan of the chest at [facility] without any evidence of coronary artery calcification.  Recently she has been having palpitations.  She brought in her smart watch which has been tracking heart rate and oxygen levels.  She noted several episodes where she had an increase in heart rate greater than 150 with associated hypoxemia.  She has had also some lower levels of oxygen at times.  She is concerned about the spikes in her heart rate.  She told me she was involved in the clinical research trials for the Apple watch when it was being developed as well.  Her PCP has already placed a 2-week ZIO monitor which she was currently wearing.  She has denied any chest pain.  PMHx:  Past Medical History:  Diagnosis Date   Anemia    blood transfusion "many years ago before 1970"   Anxiety    Arthritis    Cancer (HCC)    kidney cancer   History of hiatal hernia 2009   incisional hernia   Pneumonia    years ago, possibly age 33   Pre-diabetes     Past Surgical History:  Procedure Laterality Date   ABDOMINAL HYSTERECTOMY     1971   CHOLECYSTECTOMY N/A 02/16/2020   Procedure: LAPAROSCOPIC CHOLECYSTECTOMY;  Surgeon: Lucretia Roers, MD;  Location: [facility];  Service: General;  Laterality: N/A;   COLONOSCOPY W/ POLYPECTOMY  2017   LAPAROSCOPIC GASTRIC SLEEVE RESECTION  2008   NEPHRECTOMY Right 2008   TONSILLECTOMY     childhood   TOTAL KNEE ARTHROPLASTY Left 01/06/2017   Procedure: LEFT TOTAL KNEE ARTHROPLASTY;  Surgeon: Ollen Gross, MD;  Location: [facility];  Service: Orthopedics;   Laterality: Left;   TRIGGER FINGER RELEASE      FAMHx:  No family history on file.  SOCHx:   reports that she has quit smoking. Her smoking use included cigarettes. She has never used smokeless tobacco. She reports that she does not drink alcohol and does not use drugs.  ALLERGIES:  No Known Allergies  ROS: Pertinent items noted in HPI and remainder of comprehensive ROS otherwise negative.  HOME MEDS: Current Outpatient Medications on File Prior to Visit  Medication Sig Dispense Refill   atorvastatin (LIPITOR) 10 MG tablet Take 10 mg by mouth daily.     Cholecalciferol (VITAMIN D3) 125 MCG (5000 UT) capsule Take 15,000 Units by mouth daily.     conjugated estrogens (PREMARIN) vaginal cream Place 1 Applicatorful vaginally 3 (three) times a week.     MAGNESIUM CHLORIDE PO Take by mouth.     Multiple Vitamin (MULTIVITAMIN ADULT PO) Take by mouth daily.     omeprazole (PRILOSEC) 20 MG capsule Take 20 mg by mouth daily as needed (heartburn).     OZEMPIC, 1 MG/DOSE, 4 MG/3ML SOPN Inject 1 mg into the skin once a week.     POTASSIUM PO Take by mouth daily.     Turmeric 500 MG CAPS Take 1,000 mg by mouth daily.     albuterol (PROVENTIL) (2.5 MG/3ML) 0.083% nebulizer solution Take 2.5 mg by nebulization  3 (three) times daily. (Patient not taking: Reported on 02/16/2024)     albuterol (VENTOLIN HFA) 108 (90 Base) MCG/ACT inhaler Inhale 2 puffs into the lungs 4 (four) times daily as needed for wheezing or shortness of breath. (Patient not taking: Reported on 02/16/2024)     Ascorbic Acid (VITAMIN C) 1000 MG tablet Take 1,000 mg by mouth daily. (Patient not taking: Reported on 02/16/2024)     CRANBERRY PO Take 3 capsules by mouth daily. (Patient not taking: Reported on 02/16/2024)     diphenhydrAMINE (BENADRYL) 25 MG tablet Take 50 mg by mouth at bedtime. (Patient not taking: Reported on 02/16/2024)     FLUoxetine (PROZAC) 20 MG capsule Take 20 mg by mouth daily. (Patient not taking: Reported on  02/16/2024)     fluticasone-salmeterol (ADVAIR) 250-50 MCG/ACT AEPB Inhale 1 puff into the lungs 2 (two) times daily. (Patient not taking: Reported on 02/16/2024)     No current facility-administered medications on file prior to visit.    LABS/IMAGING: No results found for this or any previous visit (from the past 48 hours). No results found.  LIPID PANEL:    Component Value Date/Time   CHOL 187 07/11/2019 0742   TRIG 143 07/11/2019 0742   HDL 43 07/11/2019 0742   CHOLHDL 4.3 07/11/2019 0742   VLDL 29 07/11/2019 0742   LDLCALC 115 (H) 07/11/2019 0742    WEIGHTS: Wt Readings from Last 3 Encounters:  02/16/24 242 lb (109.8 kg)  01/23/23 251 lb (113.9 kg)  02/29/20 251 lb (113.9 kg)    VITALS: BP 116/68 (BP Location: Left Arm, Patient Position: Sitting, Cuff Size: Normal)   Pulse 73   Ht 5\' 7"  (1.702 m)   Wt 242 lb (109.8 kg)   SpO2 93%   BMI 37.90 kg/m   EXAM: General appearance: alert, no distress, and morbidly obese Neck: no carotid bruit, no JVD, and thyroid not enlarged, symmetric, no tenderness/mass/nodules Lungs: clear to auscultation bilaterally Heart: regular rate and rhythm, S1, S2 normal, no murmur, click, rub or gallop Abdomen: soft, non-tender; bowel sounds normal; no masses,  no organomegaly Extremities: extremities normal, atraumatic, no cyanosis or edema Pulses: 2+ and symmetric Skin: Skin color, texture, turgor normal. No rashes or lesions Neurologic: Grossly normal Psych: Pleasant  EKG: EKG Interpretation Date/Time:  Monday February 16 2024 14:39:30 EDT Ventricular Rate:  73 PR Interval:  154 QRS Duration:  90 QT Interval:  410 QTC Calculation: 451 R Axis:   16  Text Interpretation: Normal sinus rhythm with sinus arrhythmia Nonspecific T wave abnormality When compared with ECG of 12-Feb-2020 21:22, PREVIOUS ECG IS PRESENT Confirmed by Zoila Shutter [phone]) on 02/16/2024 2:56:25 PM    ASSESSMENT: Palpitations/tachycardia Intermittent  hypoxemia Morbid obesity Emphysema with chronic oxygen requirement  PLAN: 1.   [name] has been having intermittent palpitations and tachycardia.  She reports issues with oxygen desaturations as well but has been on chronic oxygen which she was wearing today.  She is on a ZIO monitor to try to identify her palpitations.  That was just placed a few days ago.  I would appreciate if her PCP reaches out with the results of that monitor which will help determine if additional therapies are necessary.  With regards to her desaturations, I suspect this is primarily pulmonary.  She may benefit from pulmonary referral as well.  Will reach out to her with further suggestions once she is completed the monitor.  Will arrange for a general follow-up in 6 months.  Iantha Fallen  C. Rennis Golden, MD, [facility], FACP  [place]  [facility]  Medical Director of the [facility]  [facility] Diplomate of the American Board of Clinical Lipidology Attending Cardiologist  Direct Dial: [phone]  Fax: [phone]  Website:  [email or web address] Nicely Sahvannah Rieser 02/16/2024, 2:56 PM

## 2024-03-09 ENCOUNTER — Ambulatory Visit: Admitting: Sports Medicine

## 2024-03-28 ENCOUNTER — Encounter (HOSPITAL_COMMUNITY): Payer: Self-pay | Admitting: Emergency Medicine

## 2024-03-28 ENCOUNTER — Emergency Department (HOSPITAL_COMMUNITY)
Admission: EM | Admit: 2024-03-28 | Discharge: 2024-03-28 | Disposition: A | Discharge status: Home / Self Care | Attending: Emergency Medicine | Admitting: Emergency Medicine

## 2024-03-28 ENCOUNTER — Other Ambulatory Visit: Payer: Self-pay

## 2024-03-28 DIAGNOSIS — D72829 Elevated white blood cell count, unspecified: Secondary | ICD-10-CM | POA: Insufficient documentation

## 2024-03-28 DIAGNOSIS — R42 Dizziness and giddiness: Secondary | ICD-10-CM | POA: Insufficient documentation

## 2024-03-28 LAB — CBC
HCT: 45.4 % (ref 36.0–46.0)
Hemoglobin: 15.1 g/dL — ABNORMAL HIGH (ref 12.0–15.0)
MCH: 30.2 pg (ref 26.0–34.0)
MCHC: 33.3 g/dL (ref 30.0–36.0)
MCV: 90.8 fL (ref 80.0–100.0)
Platelets: 273 10*3/uL (ref 150–400)
RBC: 5 MIL/uL (ref 3.87–5.11)
RDW: 14.2 % (ref 11.5–15.5)
WBC: 11.6 10*3/uL — ABNORMAL HIGH (ref 4.0–10.5)
nRBC: 0 % (ref 0.0–0.2)

## 2024-03-28 LAB — BASIC METABOLIC PANEL WITH GFR
Anion gap: 7 (ref 5–15)
BUN: 19 mg/dL (ref 8–23)
CO2: 23 mmol/L (ref 22–32)
Calcium: 9 mg/dL (ref 8.9–10.3)
Chloride: 105 mmol/L (ref 98–111)
Creatinine, Ser: 1.12 mg/dL — ABNORMAL HIGH (ref 0.44–1.00)
GFR, Estimated: 50 mL/min — ABNORMAL LOW (ref >60)
Glucose, Bld: 73 mg/dL (ref 70–99)
Potassium: 3.6 mmol/L (ref 3.5–5.1)
Sodium: 135 mmol/L (ref 135–145)

## 2024-03-28 LAB — CBG MONITORING, ED
Glucose-Capillary: 62 mg/dL — ABNORMAL LOW (ref 70–99)
Glucose-Capillary: 94 mg/dL (ref 70–99)

## 2024-03-28 NOTE — ED Triage Notes (Signed)
 Pt c/o dizziness that started after she got up from her chair around 5pm. Pt states she was notified by her smart watch that she was in A-fib (no hx) and that she needed to seek medical help. Pt denies any other symptoms. Wears O2 @ 4-5L continuously.

## 2024-03-28 NOTE — ED Provider Notes (Signed)
 This patient is an otherwise well-appearing 83 year old female who presents with an episode of dizziness that occurred today.  She states that when she started to feel this vague dizziness she tried to check her heart rate on her smart watch and states that it read atrial fibrillation.  She is now second-guessing that as when she tries to do it again here in the ER she has a similar reading but is clearly in sinus rhythm on the monitor.  She is now recognizing that the movement of her finger on the dial of the watch is causing some irregularity.  She does not have any symptoms here, she has been in normal sinus rhythm.  She had already worn a Zio patch and followed up with her family doctor.  Will refer her back to her cardiologist.  She has no symptoms at this time, she is normotensive at 132/78, her initial blood sugar was around 60 but she was able to eat and it came up to 94.  She has no significant leukocytosis or anemia or metabolic dysfunction or renal dysfunction.  EKG unremarkable  Stable for discharge, patient agreeable   Early Glisson, MD 03/28/24 2203

## 2024-03-28 NOTE — ED Provider Notes (Signed)
 [place] EMERGENCY DEPARTMENT AT [facility] Provider Note   CSN: [id number] Arrival date & time: 03/28/24  1913     History  Chief Complaint  Patient presents with   Dizziness    Carol Doyle is a 83 y.o. female.  83 year old female presenting with dizziness.  Patient was going about her normal day when she experienced a sudden onset of dizziness, "like things were dimming around me".  This sensation was present while patient was sitting and standing, did not appear to be exacerbated by positional changes.  At one point she had to brace herself against an object to avoid falling.  When this was happening she was alerted by her smart watch that she was in A-fib, she has no history of A-fib.  Patient has a recent history of "palpitations", was placed on Zio patch by her primary care provider, patient was verbally given report that her heart rate would "briefly speed up", however they did not note any arrhythmias or dysrhythmias.  Sees Dr. Maximo Spar with cardiology, has follow-up appointment within 4 to 6 months.  Patient is on a keto diet, she has eaten and drank water  today as normal, she did even have some sugary pastries earlier.  She denies headache, chest pain, shortness of breath, abdominal pain, lower extremity edema, loss of consciousness, falls.  Patient is on oxygen at baseline, 4 L nasal cannula.    Dizziness      Home Medications Prior to Admission medications   Medication Sig Start Date End Date Taking? Authorizing Provider  mirabegron  ER (MYRBETRIQ ) 50 MG TB24 tablet Take 50 mg by mouth daily. 03/09/24  Yes [provider]  albuterol  (PROVENTIL ) (2.5 MG/3ML) 0.083% nebulizer solution Take 2.5 mg by nebulization 3 (three) times daily. Patient not taking: Reported on 02/16/2024 12/11/22   [provider]  albuterol  (VENTOLIN  HFA) 108 (90 Base) MCG/ACT inhaler Inhale 2 puffs into the lungs 4 (four) times daily as needed for wheezing or  shortness of breath. Patient not taking: Reported on 02/16/2024 12/12/22   [provider]  Ascorbic Acid (VITAMIN C) 1000 MG tablet Take 1,000 mg by mouth daily. Patient not taking: Reported on 02/16/2024    [provider]  atorvastatin  (LIPITOR) 10 MG tablet Take 10 mg by mouth daily. 01/11/20   [provider]  Cholecalciferol  (VITAMIN D3) 125 MCG (5000 UT) capsule Take 15,000 Units by mouth daily.    [provider]  conjugated estrogens  (PREMARIN ) vaginal cream Place 1 Applicatorful vaginally 3 (three) times a week.    [provider]  CRANBERRY PO Take 3 capsules by mouth daily. Patient not taking: Reported on 02/16/2024    [provider]  diphenhydrAMINE  (BENADRYL ) 25 MG tablet Take 50 mg by mouth at bedtime. Patient not taking: Reported on 02/16/2024    [provider]  estradiol (ESTRACE) 0.1 MG/GM vaginal cream Place 1 Applicatorful vaginally 3 (three) times a week. 03/05/24   [provider]  FLUoxetine  (PROZAC ) 20 MG capsule Take 20 mg by mouth daily. Patient not taking: Reported on 02/16/2024    [provider]  fluticasone-salmeterol (ADVAIR) 250-50 MCG/ACT AEPB Inhale 1 puff into the lungs 2 (two) times daily. Patient not taking: Reported on 02/16/2024 12/12/22   [provider]  MAGNESIUM CHLORIDE PO Take by mouth.    [provider]  Multiple Vitamin (MULTIVITAMIN ADULT PO) Take by mouth daily.    [provider]  omeprazole (PRILOSEC) 20 MG capsule Take 20 mg by  mouth daily as needed (heartburn).    [provider]  OZEMPIC, 1 MG/DOSE, 4 MG/3ML SOPN Inject 1 mg into the skin once a week. 01/25/24   [provider]  POTASSIUM PO Take by mouth daily.    [provider]  trimethoprim  (TRIMPEX ) 100 MG tablet Take 100 mg by mouth daily as needed. 03/23/24   [provider]  Turmeric 500 MG CAPS Take 1,000 mg by mouth daily.    [provider]       Allergies    Patient has no known allergies.    Review of Systems   Review of Systems  Neurological:  Positive for light-headedness.    Physical Exam Updated Vital Signs  Vitals:   03/28/24 2100 03/28/24 2130 03/28/24 2145 03/28/24 2200  BP: (!) 107/48 (!) 90/47 132/78 (!) 125/58  Pulse:    74  Resp: 15 (!) 22 20 18   Temp:    97.9 F (36.6 C)  TempSrc:    Oral  SpO2:    98%  Weight:      Height:        Physical Exam Vitals and nursing note reviewed.  Constitutional:      General: She is not in acute distress. HENT:     Head: Normocephalic and atraumatic.  Eyes:     Extraocular Movements: Extraocular movements intact.     Pupils: Pupils are equal, round, and reactive to light.  Cardiovascular:     Rate and Rhythm: Normal rate and regular rhythm.     Pulses: Normal pulses.     Heart sounds: No murmur heard. Pulmonary:     Effort: Pulmonary effort is normal. No respiratory distress.  Abdominal:     Palpations: Abdomen is soft.     Tenderness: There is no abdominal tenderness.  Musculoskeletal:     Cervical back: Neck supple. No rigidity.     Right lower leg: No edema.     Left lower leg: No edema.     Comments: 5 out of 5 strength of bilateral upper and lower extremities   Skin:    General: Skin is warm and dry.  Neurological:     Mental Status: She is alert and oriented to person, place, and time. Mental status is at baseline.     Comments: Facial movements intact without evidence of facial droop     ED Results / Procedures / Treatments   Labs (all labs ordered are listed, but only abnormal results are displayed) Labs Reviewed  BASIC METABOLIC PANEL WITH GFR - Abnormal; Notable for the following components:      Result Value   Creatinine, Ser 1.12 (*)    GFR, Estimated 50 (*)    All other components within normal limits  CBC - Abnormal; Notable for the following components:   WBC 11.6 (*)    Hemoglobin 15.1 (*)    All other components within  normal limits  CBG MONITORING, ED - Abnormal; Notable for the following components:   Glucose-Capillary 62 (*)    All other components within normal limits  URINALYSIS, ROUTINE W REFLEX MICROSCOPIC  CBG MONITORING, ED    EKG EKG Interpretation Date/Time:  Sunday March 28 2024 19:30:45 EDT Ventricular Rate:  74 PR Interval:  188 QRS Duration:  88 QT Interval:  396 QTC Calculation: 439 R Axis:   19  Text Interpretation: Sinus rhythm with occasional Premature ventricular complexes Nonspecific T wave abnormality Abnormal ECG When compared with ECG of 16-Feb-2024 14:39, Premature ventricular  complexes are now Present Confirmed by Early Glisson ([id number]) on 03/28/2024 8:08:55 PM  Radiology No results found.  Procedures Procedures    Medications Ordered in ED Medications - No data to display  ED Course/ Medical Decision Making/ A&P                                 Medical Decision Making 83 year old female presenting with lightheadedness.  Differential diagnosis includes volume depletion, orthostatic hypotension, arrhythmia/dysrhythmia.   Co morbidities that complicate the patient evaluation  Baseline oxygen requirement   Additional history obtained:  External records from outside source obtained and reviewed including prior cardiology notes   Lab Tests:  I Ordered, and personally interpreted labs.  The pertinent results include: Creatinine of 1.12, was 0.88 four years ago.  Mild leukocytosis does not correlate clinically.  Initial CBG was 62, improved to 94 after drinking some juice.   Cardiac Monitoring: / EKG:  The patient was maintained on a cardiac monitor.  I personally viewed and interpreted the cardiac monitored which showed an underlying rhythm of: Sinus rhythm with occasional PVCs   Problem List / ED Course / Critical interventions / Medication management I have reviewed the patients home medicines and have made adjustments as needed   Test / Admission -  Considered:  Initial CBG of 62, improved to 94 after she was able to drink some juice.  Labs are noncontributory, see above for interpretation.  Orthostatic vitals obtained, no significant drop in blood pressure with positional changes.  EKG notable for sinus rhythm with occasional PVCs.  Patient had recent Zio patch, no evidence of arrhythmias or dysrhythmias per patient.  Symptoms have resolved, at this time patient is appropriate for discharge.  Follow-up with PCP and cardiologist for further workup.    Amount and/or Complexity of Data Reviewed External Data Reviewed: notes. Labs: ordered. ECG/medicine tests: ordered.           Final Clinical Impression(s) / ED Diagnoses Final diagnoses:  Lightheadedness    Rx / DC Orders ED Discharge Orders     None         Adolm Ahumada 03/28/24 2308    Early Glisson, MD 03/29/24 [phone]

## 2024-03-28 NOTE — Discharge Instructions (Addendum)
 Continue to drink plenty of fluids throughout the day.  Avoid quick positional changes. Follow up with your cardiologist Dr. Maximo Spar this week -  ER for worsening symptoms.

## 2024-03-30 ENCOUNTER — Encounter: Payer: Self-pay | Admitting: Internal Medicine

## 2024-03-30 ENCOUNTER — Ambulatory Visit (INDEPENDENT_AMBULATORY_CARE_PROVIDER_SITE_OTHER): Admitting: Internal Medicine

## 2024-03-30 VITALS — BP 122/78 | HR 82 | Ht 67.0 in

## 2024-03-30 DIAGNOSIS — J432 Centrilobular emphysema: Secondary | ICD-10-CM

## 2024-03-30 DIAGNOSIS — Z87891 Personal history of nicotine dependence: Secondary | ICD-10-CM

## 2024-03-30 DIAGNOSIS — G4733 Obstructive sleep apnea (adult) (pediatric): Secondary | ICD-10-CM

## 2024-03-30 DIAGNOSIS — R0609 Other forms of dyspnea: Secondary | ICD-10-CM

## 2024-03-30 NOTE — Progress Notes (Signed)
 Carol Doyle, female    DOB: 1941/10/15    MRN: [id number]   Brief patient profile:  83  yowf  quit smoking 1985  referred to pulmonary clinic in [place]  03/30/2024 by Dr Mason Sole  for new onset doe x 2022  indolent onset progressive assoc with about 100 lb wt gain since quit smoking  complicated by OSA on CPAP and noct 02 dep  per Dr Mason Sole since fall of 2024 per pt    History of Present Illness  03/30/2024  Pulmonary/ 1st office eval/ [facility] / [name]  Chief Complaint  Patient presents with   Dizziness    ER follow up  Dyspnea:  riding scooter at store/ walks 5 min s stopping at home on RA / limited by back > doe  Cough: none  Sleep: bed is flat/ one pillow s resp cc  hs on cpap and 02 4lpm  SABA use: not using  02: 4lpm hs only     No obvious day to day or daytime pattern/variability or assoc excess/ purulent sputum or mucus plugs or hemoptysis or cp or chest tightness, subjective wheeze or overt   hb symptoms.    Also denies any obvious fluctuation of symptoms with weather or environmental changes or other aggravating or alleviating factors except as outlined above   No unusual exposure hx or h/o childhood pna/ asthma or knowledge of premature birth.  Current Allergies, Complete Past Medical History, Past Surgical History, Family History, and Social History were reviewed in [employer] record.  ROS  The following are not active complaints unless bolded Hoarseness, sore throat, dysphagia, dental problems, itching, sneezing,  nasal congestion or discharge of excess mucus or purulent secretions, ear ache,   fever, chills, sweats, unintended wt loss or wt gain, classically pleuritic or exertional cp,  orthopnea pnd or arm/hand swelling  or leg swelling, presyncope, palpitations, abdominal pain, anorexia, nausea, vomiting, diarrhea  or change in bowel habits or change in bladder habits, change in stools or change in urine, dysuria, hematuria,   rash, arthralgias, visual complaints, headache, numbness, weakness or ataxia or problems with walking or coordination,  change in mood or  memory.            Outpatient Medications Prior to Visit  Medication Sig Dispense Refill   atorvastatin  (LIPITOR) 10 MG tablet Take 10 mg by mouth daily.     Cholecalciferol  (VITAMIN D3) 125 MCG (5000 UT) capsule Take 15,000 Units by mouth daily.     conjugated estrogens  (PREMARIN ) vaginal cream Place 1 Applicatorful vaginally 3 (three) times a week.     estradiol (ESTRACE) 0.1 MG/GM vaginal cream Place 1 Applicatorful vaginally 3 (three) times a week.     MAGNESIUM CHLORIDE PO Take by mouth.     mirabegron  ER (MYRBETRIQ ) 50 MG TB24 tablet Take 50 mg by mouth daily.     Multiple Vitamin (MULTIVITAMIN ADULT PO) Take by mouth daily.     omeprazole (PRILOSEC) 20 MG capsule Take 20 mg by mouth daily as needed (heartburn).     OZEMPIC, 1 MG/DOSE, 4 MG/3ML SOPN Inject 1 mg into the skin once a week.     POTASSIUM PO Take by mouth daily.     trimethoprim  (TRIMPEX ) 100 MG tablet Take 100 mg by mouth daily as needed.     Turmeric 500 MG CAPS Take 1,000 mg by mouth daily.     albuterol  (PROVENTIL ) (2.5 MG/3ML) 0.083% nebulizer solution Take 2.5 mg by nebulization 3 (  three) times daily. (Patient not taking: Reported on 02/16/2024)     albuterol  (VENTOLIN  HFA) 108 (90 Base) MCG/ACT inhaler Inhale 2 puffs into the lungs 4 (four) times daily as needed for wheezing or shortness of breath. (Patient not taking: Reported on 02/16/2024)     Ascorbic Acid (VITAMIN C) 1000 MG tablet Take 1,000 mg by mouth daily. (Patient not taking: Reported on 02/16/2024)     CRANBERRY PO Take 3 capsules by mouth daily. (Patient not taking: Reported on 02/16/2024)     diphenhydrAMINE  (BENADRYL ) 25 MG tablet Take 50 mg by mouth at bedtime. (Patient not taking: Reported on 02/16/2024)     FLUoxetine  (PROZAC ) 20 MG capsule Take 20 mg by mouth daily. (Patient not taking: Reported on 02/16/2024)      fluticasone-salmeterol (ADVAIR) 250-50 MCG/ACT AEPB Inhale 1 puff into the lungs 2 (two) times daily. (Patient not taking: Reported on 02/16/2024)     No facility-administered medications prior to visit.    Past Medical History:  Diagnosis Date   Anemia    blood transfusion "many years ago before 1970"   Anxiety    Arthritis    Cancer (HCC)    kidney cancer   History of hiatal hernia 2009   incisional hernia   Pneumonia    years ago, possibly age 83   Pre-diabetes       Objective:     BP 122/78   Pulse 82   Ht 5\' 7"  (1.702 m)   SpO2 96% Comment: RA  BMI 37.12 kg/m   SpO2: 96 % (RA)  Mod obese (by bmi) am  wf somewhat confused with details of care/ has rollator but not using     HEENT : Oropharynx  clear      Nasal turbinates nl    NECK :  without  apparent JVD/ palpable Nodes/TM    LUNGS: no acc muscle use,  Nl contour chest which is clear to A and P bilaterally without cough on insp or exp maneuvers   CV:  RRR  no s3 or murmur or increase in P2, and no edema   ABD:  quite obese soft and nontender   MS:   ext warm without deformities Or obvious joint restrictions  calf tenderness, cyanosis or clubbing    SKIN: warm and dry without lesions    NEURO:  alert, approp, nl sensorium with  no motor or cerebellar deficits apparent.     I personally reviewed images and agree with radiology impression as follows:  Chest CTa    02/02/24  No acute intrathoracic pathology. No CT evidence of pulmonary artery  embolus.  Mild centrilobular emphysema     Labs ordered/ reviewed:      Chemistry      Component Value Date/Time   NA 140 03/30/2024 1112   K 4.2 03/30/2024 1112   CL 104 03/30/2024 1112   CO2 21 03/30/2024 1112   BUN 13 03/30/2024 1112   CREATININE 0.87 03/30/2024 1112      Component Value Date/Time   CALCIUM  9.4 03/30/2024 1112   ALKPHOS 77 02/29/2020 1005   AST 16 02/29/2020 1005   ALT 17 02/29/2020 1005   BILITOT 0.9 02/29/2020 1005         Lab Results  Component Value Date   WBC 8.7 03/30/2024   HGB 15.0 03/30/2024   HCT 45.1 03/30/2024   MCV 90 03/30/2024   PLT 267 03/30/2024     Lab Results  Component Value Date  DDIMER 1.43 (H) 03/30/2024      Lab Results  Component Value Date   TSH 2.020 03/30/2024     BNP    03/30/2024  = 46        Assessment   DOE (dyspnea on exertion) 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 - Allergy screen 03/30/2024 >  Eos 0.3 /  IgE 17  with alpha one AT phenotype  MM  level 150  - 03/30/2024   Walked on RA  x  2  lap(s) =  approx 300  ft  @ slow/ mod pace, stopped due to knee and back pain  with lowest 02 sats 95%     When respiratory symptoms begin or become refractory well after a patient reports complete smoking cessation,  Especially when this wasn't the case while they were smoking, a red flag is raised based on the work of Dr Bland Bunnell which states:  if you quit smoking when your best day FEV1 is still well preserved it is highly unlikely you will progress to severe disease.  That is to say, once the smoking stops,  the symptoms should not suddenly erupt or markedly worsen.  If so, the differential diagnosis should include  obesity/deconditioning,  LPR/Reflux/Aspiration syndromes,  occult CHF, or  especially side effect of medications commonly used in this population.    Rec Pfts to complete the w/u  No need for inhalers in interim based on walking study today  Venous dopplers to complete to w/u for occult dvt but no evidence of PE clinically so no need to repeat CTa at this point.   Each maintenance medication was reviewed in detail including emphasizing most importantly the difference between maintenance and prns and under what circumstances the prns are to be triggered using an action plan format where appropriate.  Total time for H and P, chart review, counseling,  directly observing portions of  ambulatory 02 saturation study/ and generating customized AVS unique to this office visit / same day charting = 48 min with pt new to me                   Vernestine Gondola, MD 03/30/2024

## 2024-03-30 NOTE — Assessment & Plan Note (Addendum)
 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 - Allergy screen 03/30/2024 >  Eos 0.3 /  IgE 17  with alpha one AT phenotype  MM  level 150  - 03/30/2024   Walked on RA  x  2  lap(s) =  approx 300  ft  @ slow/ mod pace, stopped due to knee and back pain  with lowest 02 sats 95%     When respiratory symptoms begin or become refractory well after a patient reports complete smoking cessation,  Especially when this wasn't the case while they were smoking, a red flag is raised based on the work of Dr Bland Bunnell which states:  if you quit smoking when your best day FEV1 is still well preserved it is highly unlikely you will progress to severe disease.  That is to say, once the smoking stops,  the symptoms should not suddenly erupt or markedly worsen.  If so, the differential diagnosis should include  obesity/deconditioning,  LPR/Reflux/Aspiration syndromes,  occult CHF, or  especially side effect of medications commonly used in this population.    Rec Pfts to complete the w/u  No need for inhalers in interim based on walking study today  Venous dopplers to complete to w/u for occult dvt but no evidence of PE clinically so no need to repeat CTa at this point.   Each maintenance medication was reviewed in detail including emphasizing most importantly the difference between maintenance and prns and under what circumstances the prns are to be triggered using an action plan format where appropriate.  Total time for H and P, chart review, counseling,  directly observing portions of ambulatory 02 saturation study/ and generating customized AVS unique to this office visit / same day charting = 48 min with pt new to me

## 2024-03-30 NOTE — Patient Instructions (Addendum)
 My office will be contacting you by phone for referral for PFTs at [facility] to see if you have any copd or not  - if you don't hear back from my office within one week please call us  back or notify us  thru MyChart and we'll address it right away.   Please remember to go to the lab department   for your tests - we will call you with the results when they are available.      Make sure you check your oxygen saturation  AT  your highest level of activity (not after you stop)   to be sure it stays over 90% and adjust  02 flow upward to maintain this level if needed but remember to turn it back to previous settings when you stop (to conserve your supply).   Please schedule a follow up office visit in 6 weeks, call sooner if needed with all medications /inhalers/ solutions in hand so we can verify exactly what you are taking. This includes all medications from all doctors and over the counters

## 2024-04-05 ENCOUNTER — Other Ambulatory Visit: Payer: Self-pay

## 2024-04-05 ENCOUNTER — Telehealth: Payer: Self-pay | Admitting: Internal Medicine

## 2024-04-05 DIAGNOSIS — R7989 Other specified abnormal findings of blood chemistry: Secondary | ICD-10-CM

## 2024-04-05 LAB — CBC WITH DIFFERENTIAL/PLATELET
Basophils Absolute: 0.1 10*3/uL (ref 0.0–0.2)
Basos: 1 %
EOS (ABSOLUTE): 0.3 10*3/uL (ref 0.0–0.4)
Eos: 3 %
Hematocrit: 45.1 % (ref 34.0–46.6)
Hemoglobin: 15 g/dL (ref 11.1–15.9)
Immature Grans (Abs): 0 10*3/uL (ref 0.0–0.1)
Immature Granulocytes: 0 %
Lymphocytes Absolute: 3.4 10*3/uL — ABNORMAL HIGH (ref 0.7–3.1)
Lymphs: 39 %
MCH: 30.1 pg (ref 26.6–33.0)
MCHC: 33.3 g/dL (ref 31.5–35.7)
MCV: 90 fL (ref 79–97)
Monocytes Absolute: 0.6 10*3/uL (ref 0.1–0.9)
Monocytes: 7 %
Neutrophils Absolute: 4.4 10*3/uL (ref 1.4–7.0)
Neutrophils: 50 %
Platelets: 267 10*3/uL (ref 150–450)
RBC: 4.99 x10E6/uL (ref 3.77–5.28)
RDW: 13.7 % (ref 11.7–15.4)
WBC: 8.7 10*3/uL (ref 3.4–10.8)

## 2024-04-05 LAB — BASIC METABOLIC PANEL WITH GFR
BUN/Creatinine Ratio: 15 (ref 12–28)
BUN: 13 mg/dL (ref 8–27)
CO2: 21 mmol/L (ref 20–29)
Calcium: 9.4 mg/dL (ref 8.7–10.3)
Chloride: 104 mmol/L (ref 96–106)
Creatinine, Ser: 0.87 mg/dL (ref 0.57–1.00)
Glucose: 73 mg/dL (ref 70–99)
Potassium: 4.2 mmol/L (ref 3.5–5.2)
Sodium: 140 mmol/L (ref 134–144)
eGFR: 67 mL/min/{1.73_m2} (ref >59)

## 2024-04-05 LAB — TSH: TSH: 2.02 u[IU]/mL (ref 0.450–4.500)

## 2024-04-05 LAB — ALPHA-1-ANTITRYPSIN PHENOTYP: A-1 Antitrypsin: 150 mg/dL (ref 101–187)

## 2024-04-05 LAB — BRAIN NATRIURETIC PEPTIDE: BNP: 45.8 pg/mL (ref 0.0–100.0)

## 2024-04-05 LAB — IGE: IgE (Immunoglobulin E), Serum: 17 [IU]/mL (ref 6–495)

## 2024-04-05 LAB — D-DIMER, QUANTITATIVE: D-DIMER: 1.43 mg{FEU}/L — ABNORMAL HIGH (ref 0.00–0.49)

## 2024-04-05 NOTE — Telephone Encounter (Signed)
 Spoke with patient regarding the 9:00 am lower extremity vascular ultra sound at [facility] 8:45--1st floor registrations  desk for check in---patient voiced her understanding

## 2024-04-06 ENCOUNTER — Encounter: Payer: Self-pay | Admitting: Sports Medicine

## 2024-04-06 ENCOUNTER — Ambulatory Visit (HOSPITAL_COMMUNITY)
Admission: RE | Admit: 2024-04-06 | Discharge: 2024-04-06 | Disposition: A | Discharge status: Home / Self Care | Source: Ambulatory Visit | Attending: Internal Medicine | Admitting: Internal Medicine

## 2024-04-06 ENCOUNTER — Ambulatory Visit (INDEPENDENT_AMBULATORY_CARE_PROVIDER_SITE_OTHER): Admitting: Sports Medicine

## 2024-04-06 VITALS — BP 128/78 | HR 74 | Temp 97.3°F | Resp 18 | Ht 67.0 in | Wt 238.0 lb

## 2024-04-06 DIAGNOSIS — R0602 Shortness of breath: Secondary | ICD-10-CM

## 2024-04-06 DIAGNOSIS — M25561 Pain in right knee: Secondary | ICD-10-CM

## 2024-04-06 DIAGNOSIS — G8929 Other chronic pain: Secondary | ICD-10-CM | POA: Diagnosis not present

## 2024-04-06 DIAGNOSIS — R002 Palpitations: Secondary | ICD-10-CM | POA: Diagnosis not present

## 2024-04-06 DIAGNOSIS — R7989 Other specified abnormal findings of blood chemistry: Secondary | ICD-10-CM | POA: Insufficient documentation

## 2024-04-06 DIAGNOSIS — M545 Low back pain, unspecified: Secondary | ICD-10-CM

## 2024-04-06 DIAGNOSIS — K219 Gastro-esophageal reflux disease without esophagitis: Secondary | ICD-10-CM

## 2024-04-06 DIAGNOSIS — R32 Unspecified urinary incontinence: Secondary | ICD-10-CM

## 2024-04-06 NOTE — Progress Notes (Signed)
 Careteam: Patient Care Team: Theoplis Fix, MD as PCP - General (Internal Medicine)  PLACE OF SERVICE:  [facility]  Advanced Directive information    No Known Allergies  Chief Complaint  Patient presents with   Establish Care     New Patient     Discussed the use of AI scribe software for clinical note transcription with the patient, who gave verbal consent to proceed.  History of Present Illness    Carol Doyle is an 83 year old female with knee osteoarthritis and emphysema who presents to establish care   She experiences chronic knee pain due to osteoarthritis, describing it as 'bone on bone' with no cartilage in her knees. She has undergone a left knee replacement, which had a difficult recovery. The pain is constant, exacerbated by walking or standing, and she can only walk short distances, such as from her house to her car. She uses turmeric for inflammation but avoids other pain medications due to having only one kidney. She has tried cortisone injections without relief.  She also experiences back pain, which she has had since she was 83 years old. The pain is mostly constant and sometimes radiates to the front of her legs. No tingling or numbness is present.  She has a history of breathing issues, initially thought to be asthma, but later identified as mild emphysema. She does not use an albuterol  inhaler regularly, only as needed. She saw pulmonology recently and had u/s which did not show DVT.  She has a history of elevated heart rate, with episodes reaching up to 170 bpm, and experiences palpitations frequently. She has had a Holter monitor placed, but the results have not been communicated to her cardiologist yet.  She has a history of diabetes, currently managed with a keto diet and Ozempic, which she uses for weight management. She has lost about 20 pounds. Her last A1c was reported as 6.7, but she is unsure of her current diabetic status.  She lives  alone, manages her own groceries and medications, and has her groceries delivered. She reports memory issues, such as forgetting to turn off the stove, but manages with the help of her dog. No depression or significant falls in the past six months.  Review of Systems:  Review of Systems  Constitutional:  Negative for chills and fever.  HENT:  Negative for congestion and sore throat.   Eyes:  Negative for double vision.  Respiratory:  Positive for shortness of breath (exertional). Negative for cough and sputum production.   Cardiovascular:  Negative for chest pain, palpitations and leg swelling.  Gastrointestinal:  Negative for abdominal pain, heartburn and nausea.  Genitourinary:  Negative for dysuria, frequency and hematuria.  Musculoskeletal:  Positive for joint pain. Negative for falls and myalgias.  Neurological:  Negative for dizziness.   Negative unless indicated in HPI.   Past Medical History:  Diagnosis Date   Anemia    blood transfusion "many years ago before 1970"   Anxiety    Arthritis    Cancer (HCC)    kidney cancer   History of hiatal hernia 2009   incisional hernia   Pneumonia    years ago, possibly age 27   Pre-diabetes    Past Surgical History:  Procedure Laterality Date   ABDOMINAL HYSTERECTOMY     1971   CHOLECYSTECTOMY N/A 02/16/2020   Procedure: LAPAROSCOPIC CHOLECYSTECTOMY;  Surgeon: Awilda Bogus, MD;  Location: [facility];  Service: General;  Laterality: N/A;   COLONOSCOPY  W/ POLYPECTOMY  2017   LAPAROSCOPIC GASTRIC SLEEVE RESECTION  2008   NEPHRECTOMY Right 2008   TONSILLECTOMY     childhood   TOTAL KNEE ARTHROPLASTY Left 01/06/2017   Procedure: LEFT TOTAL KNEE ARTHROPLASTY;  Surgeon: Liliane Rei, MD;  Location: [facility];  Service: Orthopedics;  Laterality: Left;   TRIGGER FINGER RELEASE     Social History:   reports that she has quit smoking. Her smoking use included cigarettes. She has never used smokeless tobacco. She reports that she does not  drink alcohol  and does not use drugs.  History reviewed. No pertinent family history.  Medications: Patient's Medications  New Prescriptions   No medications on file  Previous Medications   ALBUTEROL  (PROVENTIL ) (2.5 MG/3ML) 0.083% NEBULIZER SOLUTION    Take 2.5 mg by nebulization 3 (three) times daily.   ALBUTEROL  (VENTOLIN  HFA) 108 (90 BASE) MCG/ACT INHALER    Inhale 2 puffs into the lungs 4 (four) times daily as needed for wheezing or shortness of breath.   ASCORBIC ACID (VITAMIN C) 1000 MG TABLET    Take 1,000 mg by mouth daily.   ATORVASTATIN  (LIPITOR) 10 MG TABLET    Take 10 mg by mouth daily.   CHOLECALCIFEROL  (VITAMIN D3) 125 MCG (5000 UT) CAPSULE    Take 15,000 Units by mouth daily.   COENZYME Q10 (COQ-10) 200 MG CAPS    Take 200 mg by mouth daily in the afternoon.   CONJUGATED ESTROGENS  (PREMARIN ) VAGINAL CREAM    Place 1 Applicatorful vaginally 3 (three) times a week.   CRANBERRY PO    Take 3 capsules by mouth daily.   CYANOCOBALAMIN (VITAMIN B12) 500 MCG TABLET    Take 500 mcg by mouth daily.   DIPHENHYDRAMINE  (BENADRYL ) 25 MG TABLET    Take 50 mg by mouth at bedtime.   ESTRADIOL (ESTRACE) 0.1 MG/GM VAGINAL CREAM    Place 1 Applicatorful vaginally 3 (three) times a week.   FLUOXETINE  (PROZAC ) 20 MG CAPSULE    Take 20 mg by mouth daily.   MAGNESIUM CHLORIDE PO    Take by mouth.   MILK THISTLE 175 MG TABLET    Take 175 mg by mouth daily.   MIRABEGRON  ER (MYRBETRIQ ) 50 MG TB24 TABLET    Take 50 mg by mouth daily.   MULTIPLE VITAMIN (MULTIVITAMIN ADULT PO)    Take by mouth daily.   OMEPRAZOLE (PRILOSEC) 20 MG CAPSULE    Take 20 mg by mouth daily as needed (heartburn).   OZEMPIC, 1 MG/DOSE, 4 MG/3ML SOPN    Inject 1 mg into the skin once a week.   POTASSIUM PO    Take by mouth daily.   THIAMINE 250 MG TABLET    Take 250 mg by mouth daily.   TRIMETHOPRIM  (TRIMPEX ) 100 MG TABLET    Take 100 mg by mouth 3 (three) times a week.   TURMERIC 500 MG CAPS    Take 1,000 mg by mouth daily.   Modified Medications   No medications on file  Discontinued Medications   No medications on file    Physical Exam: Vitals:   04/06/24 1410  BP: 128/78  Pulse: 74  Resp: 18  Temp: (!) 97.3 F (36.3 C)  SpO2: 92%  Weight: 238 lb (108 kg)  Height: 5\' 7"  (1.702 m)   Body mass index is 37.28 kg/m. BP Readings from Last 3 Encounters:  04/06/24 128/78  03/30/24 122/78  03/28/24 (!) 125/58   Wt Readings from Last 3 Encounters:  04/06/24 238 lb (108 kg)  03/28/24 237 lb (107.5 kg)  02/16/24 242 lb (109.8 kg)    Physical Exam Constitutional:      Appearance: Normal appearance.  HENT:     Head: Normocephalic.  Cardiovascular:     Rate and Rhythm: Normal rate and regular rhythm.  Pulmonary:     Effort: Pulmonary effort is normal. No respiratory distress.     Breath sounds: Rales (Lower lung fields) present. No wheezing.  Abdominal:     General: Bowel sounds are normal. There is no distension.     Tenderness: There is no abdominal tenderness. There is no guarding.     Comments:    Musculoskeletal:        General: Swelling present.     Comments: Non pitting odema  Neurological:     Mental Status: She is alert. Mental status is at baseline.     Motor: No weakness.     Labs reviewed: Basic Metabolic Panel: Recent Labs    03/28/24 1944 03/30/24 1112  NA 135 140  K 3.6 4.2  CL 105 104  CO2 23 21  GLUCOSE 73 73  BUN 19 13  CREATININE 1.12* 0.87  CALCIUM  9.0 9.4  TSH  --  2.020   Liver Function Tests: No results for input(s): "AST", "ALT", "ALKPHOS", "BILITOT", "PROT", "ALBUMIN" in the last 8760 hours. No results for input(s): "LIPASE", "AMYLASE" in the last 8760 hours. No results for input(s): "AMMONIA" in the last 8760 hours. CBC: Recent Labs    03/28/24 1944 03/30/24 1112  WBC 11.6* 8.7  NEUTROABS  --  4.4  HGB 15.1* 15.0  HCT 45.4 45.1  MCV 90.8 90  PLT 273 267   Lipid Panel: No results for input(s): "CHOL", "HDL", "LDLCALC", "TRIG", "CHOLHDL",  "LDLDIRECT" in the last 8760 hours. TSH: Recent Labs    03/30/24 1112  TSH 2.020   A1C: Lab Results  Component Value Date   HGBA1C 5.9 (H) 02/15/2020    Assessment and Plan Assessment & Plan  1. Chronic pain of right knee (Primary) No redness Minimal swelling Will refer to Physical therapy  Instructed patient to take tylenol  650 mg q6 prn, voltaren gel - Ambulatory referral to Physical Therapy  2. Chronic bilateral low back pain without sciatica No red flag signs Take tylenol  prn   3. SOB (shortness of breath) on exertion Basal crackles  Pt was evaluated by pulmonology and is awaiting on repeat PFT   4. Palpitations Pt had zio patch which was ordered by her PCP  She saw cardiology few months back but they were waiting for the PCP report Informed patient to send the results to her cardiology office   5. Urinary incontinence, unspecified type Cont with Myrbetriq    6. Gastroesophageal reflux disease, unspecified whether esophagitis present  Avoid spicy foods   DM  Pt reports that she is diabetic and her previous PCP started ozempic  Instructed patient to avoid high carbohydrate foods and exercise regularly  Other orders - Coenzyme Q10 (COQ-10) 200 MG CAPS; Take 200 mg by mouth daily in the afternoon. - milk thistle 175 MG tablet; Take 175 mg by mouth daily. - cyanocobalamin (VITAMIN B12) 500 MCG tablet; Take 500 mcg by mouth daily. - thiamine 250 MG tablet; Take 250 mg by mouth daily.

## 2024-05-09 NOTE — Progress Notes (Unsigned)
 Carol Doyle, female    DOB: [birth year]    MRN: [id number]   Brief patient profile:  82  yowf  quit smoking 1985  referred to pulmonary clinic in [place]  03/30/2024 by Dr Carol Doyle  for new onset doe x 2022  indolent onset progressive assoc with about 100 lb wt gain since quit smoking  complicated by OSA on CPAP and noct 02 dep  per Dr Carol Doyle since fall of 2024 per pt    History of Present Illness  03/30/2024  Pulmonary/ 1st office eval/ Carol Doyle / [facility]  Chief Complaint  Patient presents with   Dizziness    ER follow up  Dyspnea:  riding scooter at store/ walks 5 min s stopping at home on RA / limited by back > doe  Cough: none  Sleep: bed is flat/ one pillow s resp cc  hs on cpap and 02 4lpm  SABA use: not using  02: 4lpm hs only  Rec My office will be contacting you by phone for referral for PFTs   Please remember to go to the lab department   for your tests - we will call you with the results when they are available. Make sure you check your oxygen saturation  AT  your highest level of activity (not after you stop)   to be sure it stays over 90%   Please schedule a follow up office visit in 6 weeks, call sooner if needed with all meds  labs - Allergy screen 03/30/2024 >  Eos 0.3 /  IgE 17  with alpha one AT phenotype  MM  level 150  - Pos d dimer  03/30/24 > 04/06/2024   venous dopplers neg both LEs   05/12/2024  f/u ov/ office/Carol Doyle re: new onset doe x 2022  indolent onset progressive doe   Chief Complaint  Patient presents with   Shortness of Breath  Dyspnea:  more limited by L hip than doe/ water  aerobics going fine / uses scooter to shop  Cough: some in am when get off cpap but min mucoid  Sleeping: flat bed on side with one pillow cpap s resp cc  SABA use: not using at all  02: 4lpm at bedtime / not using at rest at home nor with exertion      No obvious day to day or daytime variability or assoc excess/ purulent sputum or mucus plugs or  hemoptysis or cp or chest tightness, subjective wheeze or overt sinus or hb symptoms.    Also denies any obvious fluctuation of symptoms with weather or environmental changes or other aggravating or alleviating factors except as outlined above   No unusual exposure hx or h/o childhood pna/ asthma or knowledge of premature birth.  Current Allergies, Complete Past Medical History, Past Surgical History, Family History, and Social History were reviewed in [employer] record.  ROS  The following are not active complaints unless bolded Hoarseness, sore throat, dysphagia, dental problems, itching, sneezing,  nasal congestion or discharge of excess mucus or purulent secretions, ear ache,   fever, chills, sweats, unintended wt loss or wt gain, classically pleuritic or exertional cp,  orthopnea pnd or arm/hand swelling  or leg swelling, presyncope, palpitations, abdominal pain, anorexia, nausea, vomiting, diarrhea  or change in bowel habits or change in bladder habits, change in stools or change in urine, dysuria, hematuria,  rash, arthralgias, visual complaints, headache, numbness, weakness or ataxia or problems with walking/uses cane  or coordination,  change  in mood or  memory.        Current Meds  Medication Sig   albuterol  (VENTOLIN  HFA) 108 (90 Base) MCG/ACT inhaler Inhale 2 puffs into the lungs 4 (four) times daily as needed for wheezing or shortness of breath.   atorvastatin  (LIPITOR) 10 MG tablet Take 10 mg by mouth daily.   Cholecalciferol  (VITAMIN D3) 125 MCG (5000 UT) capsule Take 15,000 Units by mouth daily.   Coenzyme Q10 (COQ-10) 200 MG CAPS Take 200 mg by mouth daily in the afternoon.   cyanocobalamin (VITAMIN B12) 500 MCG tablet Take 500 mcg by mouth daily.   estradiol (ESTRACE) 0.1 MG/GM vaginal cream Place 1 Applicatorful vaginally 3 (three) times a week.   MAGNESIUM CHLORIDE PO Take by mouth.   milk thistle 175 MG tablet Take 175 mg by mouth daily.   mirabegron   ER (MYRBETRIQ ) 50 MG TB24 tablet Take 50 mg by mouth daily.   Multiple Vitamin (MULTIVITAMIN ADULT PO) Take by mouth daily.   OZEMPIC, 1 MG/DOSE, 4 MG/3ML SOPN Inject 1 mg into the skin once a week.   POTASSIUM PO Take by mouth daily.   PROAIR  RESPICLICK 108 (90 Base) MCG/ACT AEPB SMARTSIG:2 Puff(s) Via Inhaler 4 Times Daily PRN   thiamine 250 MG tablet Take 250 mg by mouth daily.   trimethoprim  (TRIMPEX ) 100 MG tablet Take 100 mg by mouth 3 (three) times a week.   Turmeric 500 MG CAPS Take 1,950 mg by mouth daily.   [DISCONTINUED] conjugated estrogens  (PREMARIN ) vaginal cream Place 1 Applicatorful vaginally 3 (three) times a week.             Past Medical History:  Diagnosis Date   Anemia    blood transfusion "many years ago before 1970"   Anxiety    Arthritis    Cancer (HCC)    kidney cancer   History of hiatal hernia 2009   incisional hernia   Pneumonia    years ago, possibly age 60   Pre-diabetes       Objective:       Wt Readings from Last 3 Encounters:  05/12/24 238 lb (108 kg)  04/06/24 238 lb (108 kg)  03/28/24 237 lb (107.5 kg)      Vital signs reviewed  05/12/2024  - Note at rest 02 sats  93% on RA   General appearance:    amb pleasant wf nad mod obese by BMI    HEENT : Oropharynx  clear      Nasal turbinates nl    NECK :  without  apparent JVD/ palpable Nodes/TM    LUNGS: no acc muscle use,  Nl contour chest which is clear to A and P bilaterally without cough on insp or exp maneuvers   CV:  RRR  no s3 or murmur or increase in P2, and no edema   ABD:  soft and nontender   MS:   ext warm without deformities Or obvious joint restrictions  calf tenderness, cyanosis or clubbing    SKIN: warm and dry without lesions    NEURO:  alert, approp, nl sensorium with  no motor or cerebellar deficits apparent.       Labs ordered/ reviewed:      Chemistry      Component Value Date/Time   NA 140 03/30/2024 1112   K 4.2 03/30/2024 1112   CL 104  03/30/2024 1112   CO2 21 03/30/2024 1112   BUN 13 03/30/2024 1112   CREATININE 0.87 03/30/2024 1112  Component Value Date/Time   CALCIUM  9.4 03/30/2024 1112   ALKPHOS 77 02/29/2020 1005   AST 16 02/29/2020 1005   ALT 17 02/29/2020 1005   BILITOT 0.9 02/29/2020 1005        Lab Results  Component Value Date   WBC 8.7 03/30/2024   HGB 15.0 03/30/2024   HCT 45.1 03/30/2024   MCV 90 03/30/2024   PLT 267 03/30/2024     Lab Results  Component Value Date   DDIMER 1.43 (H) 03/30/2024      Lab Results  Component Value Date   TSH 2.020 03/30/2024       BNP     05/12/2024  =  46      Assessment

## 2024-05-12 ENCOUNTER — Encounter: Payer: Self-pay | Admitting: Internal Medicine

## 2024-05-12 ENCOUNTER — Ambulatory Visit (INDEPENDENT_AMBULATORY_CARE_PROVIDER_SITE_OTHER): Admitting: Internal Medicine

## 2024-05-12 VITALS — BP 112/69 | HR 71 | Ht 67.0 in | Wt 238.0 lb

## 2024-05-12 DIAGNOSIS — R0609 Other forms of dyspnea: Secondary | ICD-10-CM | POA: Diagnosis not present

## 2024-05-12 NOTE — Patient Instructions (Addendum)
 My office will be contacting you by phone for referral for echocardiogram   - if you don't hear back from my office within one week please call us  back or notify us  thru MyChart and we'll address it right away.  Pulmonary follow up is as needed

## 2024-05-12 NOTE — Assessment & Plan Note (Addendum)
 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 - Allergy screen 03/30/2024 >  Eos 0.3 /  IgE 17  with alpha one AT phenotype  MM  level 150  - 03/30/2024   Walked on RA  x  2  lap(s) =  approx 300  ft  @ slow/ mod pace, stopped due to knee and back pain  with lowest 02 sats 95%   - Pos d dimer @ 1.43   03/30/24 > 04/06/2024   venous dopplers neg both LEs - Echo 05/12/2024 >>>  Need to r/o PH in this setting but unless there is evidence for it I would not pursue occult PE with this range D dimer, espcially with her reassuring excellent ex tol at water  aerobics, which is a typical story for doe in obese pts (much easy due to buoyancy which negates the wt bearing component in the obese pt with doe  05/12/2024  After extensive coaching inhaler device,  effectiveness =    90% with respiclick technique  to be used as follows: Re SABA :  I spent extra time with pt today reviewing appropriate use of albuterol  for prn use on exertion with the following points: 1) saba is for relief of sob that does not improve by walking a slower pace or resting but rather if the pt does not improve after trying this first. 2) If the pt is convinced, as many are, that saba helps recover from activity faster then it's easy to tell if this is the case by re-challenging : ie stop, take the inhaler, then p 5 minutes try the exact same activity (intensity of workload) that just caused the symptoms and see if they are substantially diminished or not after saba 3) if there is an activity that reproducibly causes the symptoms, try the saba 15 min before the activity on alternate days   If in fact the saba really does help, then fine to continue to use it prn but advised may need to look closer at the maintenance regimen being used (for now =0)  to achieve better control of airways disease with exertion.           Each maintenance medication was reviewed in detail  including emphasizing most importantly the difference between maintenance and prns and under what circumstances the prns are to be triggered using an action plan format where appropriate.  Total time for H and P, chart review, counseling, reviewing dpi  device(s) and generating customized AVS unique to this office visit / same day charting = 32 min summary final f/u ov

## 2024-05-18 ENCOUNTER — Ambulatory Visit: Admitting: Pulmonary Disease

## 2024-05-24 ENCOUNTER — Ambulatory Visit: Admitting: Internal Medicine

## 2024-08-05 ENCOUNTER — Telehealth: Payer: Self-pay | Admitting: Internal Medicine

## 2024-08-05 NOTE — Telephone Encounter (Signed)
 Copied from CRM #[id number]. Topic: Appointments - Appointment Cancel/Reschedule >> Aug 05, 2024 12:39 PM Chantha C wrote: Patient/patient representative is calling to cancel or reschedule an appointment. Refer to attachments for appointment information.  Patient is moving to [place]  and needs to cancel appointment for 08/12/24 at 10:30 am appointment. Patient is leaving on this Friday and wants to know if she needs to come into the office to get her medical records. Patient was upset and hung up the phone before being able to give her anymore information or assistant. Unable to reached [facility] [place], got [place] [facility]- advised to send a crm. Please advise and cancel patient's appointment.  Patient was informed she will need to come by the office and sign a medical records release form to obtain her records.  Patient is also wondering if Dr. Darlean can recommend a pulmonologist in [facility], [place] 

## 2024-08-10 ENCOUNTER — Ambulatory Visit: Admitting: Sports Medicine

## 2024-08-12 ENCOUNTER — Ambulatory Visit (HOSPITAL_COMMUNITY)
